# Patient Record
Sex: Male | Born: 1970 | Race: Black or African American | Hispanic: No | Marital: Single | Smoking: Never smoker
Health system: Southern US, Community
[De-identification: ages and names within clinical notes are randomized; demographics above are authoritative.]

## PROBLEM LIST (undated history)

## (undated) DIAGNOSIS — R011 Cardiac murmur, unspecified: Secondary | ICD-10-CM

## (undated) DIAGNOSIS — B1910 Unspecified viral hepatitis B without hepatic coma: Secondary | ICD-10-CM

## (undated) DIAGNOSIS — F259 Schizoaffective disorder, unspecified: Secondary | ICD-10-CM

## (undated) DIAGNOSIS — B159 Hepatitis A without hepatic coma: Secondary | ICD-10-CM

## (undated) DIAGNOSIS — F79 Unspecified intellectual disabilities: Secondary | ICD-10-CM

## (undated) DIAGNOSIS — Q249 Congenital malformation of heart, unspecified: Secondary | ICD-10-CM

---

## 2011-02-24 ENCOUNTER — Emergency Department (HOSPITAL_COMMUNITY)
Admission: EM | Admit: 2011-02-24 | Discharge: 2011-02-27 | Disposition: A | Discharge status: Home / Self Care | Payer: Medicaid Other | Attending: Emergency Medicine | Admitting: Emergency Medicine

## 2011-02-24 DIAGNOSIS — R45850 Homicidal ideations: Secondary | ICD-10-CM | POA: Insufficient documentation

## 2011-02-24 DIAGNOSIS — F79 Unspecified intellectual disabilities: Secondary | ICD-10-CM | POA: Insufficient documentation

## 2011-02-24 DIAGNOSIS — R45851 Suicidal ideations: Secondary | ICD-10-CM | POA: Insufficient documentation

## 2011-02-24 LAB — BASIC METABOLIC PANEL
BUN: 11 mg/dL (ref 6–23)
CO2: 28 mEq/L (ref 19–32)
Chloride: 100 mEq/L (ref 96–112)
Creatinine, Ser: 0.72 mg/dL (ref 0.4–1.5)
Glucose, Bld: 64 mg/dL — ABNORMAL LOW (ref 70–99)
Potassium: 3.7 mEq/L (ref 3.5–5.1)

## 2011-02-24 LAB — RAPID URINE DRUG SCREEN, HOSP PERFORMED
Amphetamines: NOT DETECTED
Benzodiazepines: NOT DETECTED
Tetrahydrocannabinol: NOT DETECTED

## 2011-02-24 LAB — CBC
HCT: 43.4 % (ref 39.0–52.0)
Hemoglobin: 14 g/dL (ref 13.0–17.0)
MCH: 27.5 pg (ref 26.0–34.0)
MCHC: 32.3 g/dL (ref 30.0–36.0)
MCV: 85.1 fL (ref 78.0–100.0)
RBC: 5.1 MIL/uL (ref 4.22–5.81)

## 2011-02-24 LAB — DIFFERENTIAL
Basophils Relative: 0 % (ref 0–1)
Eosinophils Absolute: 0.1 10*3/uL (ref 0.0–0.7)
Eosinophils Relative: 1 % (ref 0–5)
Lymphocytes Relative: 26 % (ref 12–46)
Neutro Abs: 7.1 10*3/uL (ref 1.7–7.7)

## 2011-02-24 LAB — ETHANOL: Alcohol, Ethyl (B): <11 mg/dL — ABNORMAL HIGH (ref 0–10)

## 2011-02-27 DIAGNOSIS — F329 Major depressive disorder, single episode, unspecified: Secondary | ICD-10-CM

## 2011-03-07 NOTE — Consult Note (Signed)
NAMEVEDANTH, SIRICO NO.:  [id number]  MEDICAL RECORD NO.:  [id number]  LOCATION:  [facility]                          FACILITY:  [facility]  PHYSICIAN:  Conni Slipper, MDDATE OF BIRTH:  [birth year]  DATE OF CONSULTATION:  02/27/2011 DATE OF DISCHARGE:  02/27/2011                                CONSULTATION   IDENTIFICATION:  Randall Gomez is a 40 year old single African American young male who was presented to the [facility] with complaining of nervousness, misbehavior.  Reportedly, his aunt called the Emergency Medical Services for behavioral problems.  HISTORY OF PRESENT ILLNESS:  Randall Gomez has been suffering with chronic emotional and behavior problems.  He has been suffering with unknown depression, behavior problems, unable to follow the instructions.  He also has known moderate mental retardation.  The patient reported he has been living with his aunt and uncle and he was unable to behave well. As a result, he has been brought into the emergency department.  The patient denied symptoms of depression, disturbance of sleep, loss of interest.  The patient denied loss of weight, appetite, concentration, or interest.  The patient reported he has been seeing a physician at [facility] for the nervousness.  He has been given citalopram 20 mg daily and also amlodipine 10 mg daily.  The patient reported he has been compliant with his medications.  His last medication taken was February 26, 2011.  PAST PSYCHIATRIC HISTORY:  Not significant.  PAST MEDICAL HISTORY:  He has no significant chronic medical conditions.  CURRENT MEDICATIONS: 1. Citalopram 20 mg daily in the morning. 2. Amlodipine 10 mg daily in the morning.  He has no known drug allergies.  FAMILY HISTORY:  The patient lives with his aunt and uncle, however, he does not have any other family members close by to him.  PHYSICAL EXAMINATION:  MENTAL STATUS:  Randall Gomez is a tall, slender  African American male who was appeared in hospital gown.  He was initially sitting in a chair, later he was able to stand up, walk around a little bit, and also fidgety with his hands.  He has a tissue in his hand, moving from left to right during this evaluation.  The patient reported he has nervousness ongoing, but is not nervous about telepsychiatry equipment.  The patient denied being depressed.  He has appropriately bright and full affect.  He has normal speech, but difficult to understand, sometimes need to be repeated, asking to repeat himself.  He states first letter of his name, but is not clear on the rest of the letters.  The patient denied any suicidal ideation or homicidal ideation, intentions, or plans.  He has not appeared to be interacting with internal stimuli.  He does not have hallucinations either auditory or visual hallucinations.  He does not seem to be paranoid or delusional during this evaluation.  He has fair-to-poor insight, judgment, and impulse control.  DIAGNOSIS:  Axis I: 1. Depressive disorder, not otherwise specified. 2. Anxiety disorder, not otherwise specified. 3. Relationship problems with aunt and uncle. Axis II:  Moderate mental retardation. Axis III:  None. AXIS IV:  Problems with primary support, financial difficulties,  disability, education difficulties. AXIS V:  48-50.  TREATMENT PLAN:  Review of the ACT Team assessment and with information provided during the interview, the patient does not meet criteria for acute psychiatric hospitalization.  Recommended to discharge home with family members and also recommended to follow up with primary care doctor, Dr. Effie Shy, at [facility].  Recommended to compliant with his medication management.  He may need to come back if his clinical situation changes while he is gone from the hospital.     Conni Slipper, MD     JRJ/MEDQ  D:  02/27/2011  T:  02/28/2011  Job:  [id number]  Electronically  Signed by Leata Mouse MD on 03/07/2011 11:59:08 AM

## 2011-05-29 ENCOUNTER — Emergency Department (HOSPITAL_COMMUNITY)
Admission: EM | Admit: 2011-05-29 | Discharge: 2011-05-30 | Disposition: A | Discharge status: Home / Self Care | Payer: Medicaid Other | Attending: Emergency Medicine | Admitting: Emergency Medicine

## 2011-05-29 DIAGNOSIS — F79 Unspecified intellectual disabilities: Secondary | ICD-10-CM | POA: Insufficient documentation

## 2011-05-29 DIAGNOSIS — R4182 Altered mental status, unspecified: Secondary | ICD-10-CM | POA: Insufficient documentation

## 2011-05-29 HISTORY — DX: Unspecified intellectual disabilities: F79

## 2011-05-29 LAB — BASIC METABOLIC PANEL
BUN: 17 mg/dL (ref 6–23)
Chloride: 101 mEq/L (ref 96–112)
GFR calc Af Amer: >60 mL/min (ref >60)
GFR calc non Af Amer: >60 mL/min (ref >60)
Glucose, Bld: 96 mg/dL (ref 70–99)
Potassium: 3.8 mEq/L (ref 3.5–5.1)
Sodium: 133 mEq/L — ABNORMAL LOW (ref 135–145)

## 2011-05-29 LAB — CBC
Hemoglobin: 14 g/dL (ref 13.0–17.0)
MCH: 28.3 pg (ref 26.0–34.0)
Platelets: 271 10*3/uL (ref 150–400)
RBC: 4.94 MIL/uL (ref 4.22–5.81)
WBC: 9.8 10*3/uL (ref 4.0–10.5)

## 2011-05-29 LAB — DIFFERENTIAL
Eosinophils Absolute: 0.3 10*3/uL (ref 0.0–0.7)
Lymphs Abs: 2.3 10*3/uL (ref 0.7–4.0)
Monocytes Relative: 9 % (ref 3–12)
Neutro Abs: 6.3 10*3/uL (ref 1.7–7.7)
Neutrophils Relative %: 64 % (ref 43–77)

## 2011-05-29 LAB — RAPID URINE DRUG SCREEN, HOSP PERFORMED
Barbiturates: NOT DETECTED
Benzodiazepines: NOT DETECTED
Cocaine: NOT DETECTED
Opiates: NOT DETECTED
Tetrahydrocannabinol: NOT DETECTED

## 2011-05-29 MED ORDER — ACETAMINOPHEN 325 MG PO TABS: 650.0000 mg | ORAL_TABLET | ORAL | Status: DC | PRN

## 2011-05-29 MED ORDER — ALUM & MAG HYDROXIDE-SIMETH 200-200-20 MG/5ML PO SUSP: 30.0000 mL | ORAL | Status: DC | PRN

## 2011-05-29 MED ORDER — LORAZEPAM 1 MG PO TABS: 1.0000 mg | ORAL_TABLET | Freq: Three times a day (TID) | ORAL | Status: DC | PRN

## 2011-05-29 MED ORDER — RISPERIDONE 1 MG PO TABS
2.0000 mg | ORAL_TABLET | Freq: Every day | ORAL | Status: DC
  Administered 2011-05-30: 2 mg via ORAL
  Filled 2011-05-29: qty 1

## 2011-05-29 MED ORDER — IBUPROFEN 400 MG PO TABS: 600.0000 mg | ORAL_TABLET | Freq: Three times a day (TID) | ORAL | Status: DC | PRN

## 2011-05-29 MED ORDER — ONDANSETRON HCL 4 MG PO TABS: 4.0000 mg | ORAL_TABLET | Freq: Three times a day (TID) | ORAL | Status: DC | PRN

## 2011-05-29 MED ORDER — ZIPRASIDONE HCL 20 MG PO CAPS
20.0000 mg | ORAL_CAPSULE | Freq: Two times a day (BID) | ORAL | Status: DC
  Administered 2011-05-30: 20 mg via ORAL
  Filled 2011-05-29: qty 1

## 2011-05-29 MED ORDER — ZIPRASIDONE MESYLATE 20 MG IM SOLR
10.0000 mg | Freq: Once | INTRAMUSCULAR | Status: AC
  Administered 2011-05-30: 10 mg via INTRAMUSCULAR
  Filled 2011-05-29: qty 20

## 2011-05-29 MED ORDER — ZOLPIDEM TARTRATE 5 MG PO TABS: 5.0000 mg | ORAL_TABLET | Freq: Every evening | ORAL | Status: DC | PRN

## 2011-05-29 MED ORDER — NICOTINE 21 MG/24HR TD PT24
21.0000 mg | MEDICATED_PATCH | Freq: Every day | TRANSDERMAL | Status: DC
  Filled 2011-05-29: qty 1

## 2011-05-29 NOTE — ED Notes (Signed)
Pt  Sitting on edge of bed watching TV. States " my aunt brought me here to talk to the man for mental health."  Pt denies suicidal thoughts or thoughts to harm anyone. Pt says he raked leaves and grass in the yard today, ate lunch and finished the yard. Denies aggressiveness with aunt.

## 2011-05-29 NOTE — ED Notes (Signed)
Pt reports needing help for his mental illness.  Pt is requesting a "room downstairs and something to eat".  nad noted

## 2011-05-29 NOTE — ED Provider Notes (Addendum)
Scribed for Donnetta Hutching, MD, the patient was seen in room [facility]09[facility]09 . This chart was scribed by Ellie Lunch. This patient's care was started at 6:04 PM.   CSN: [id number] Arrival date & time: 05/29/2011  5:44 PM  Chief Complaint  Patient presents with  . Psychiatric Evaluation    Level 5 caveat for altered mental status. Randall Gomez is a 40 y.o. male with a history of mental retardation presents to the Emergency Department for psychiatric evaluation. Pt states he was by his aunt for admission. Pt says he was "acting up" and his mind is racing. Denies Suicidal and homicidal ideations. Level V caveat mental retardation in an urgent need for intervention Patient seen at 18:04.   Past Medical History  Diagnosis Date  . Mental retardation     History reviewed. No pertinent past surgical history.  No family history on file.  History  Substance Use Topics  . Smoking status: Not on file  . Smokeless tobacco: Not on file  . Alcohol Use:   lives with aunt   Review of Systems  Unable to perform ROS: Mental status change  Psychiatric/Behavioral:       Flight of ideas    Physical Exam  BP 148/81  Pulse 115  Temp(Src) 98.2 F (36.8 C) (Oral)  Resp 21  SpO2 99%  Physical Exam  Nursing note and vitals reviewed. Constitutional: He is oriented to person, place, and time. He appears well-developed and well-nourished.  HENT:  Head: Normocephalic and atraumatic.  Eyes: Conjunctivae and EOM are normal. Pupils are equal, round, and reactive to light.  Neck: Neck supple.  Cardiovascular: Normal rate and regular rhythm.   Pulmonary/Chest: Effort normal and breath sounds normal.  Abdominal: Soft. Bowel sounds are normal.  Musculoskeletal: Normal range of motion.  Neurological: He is alert and oriented to person, place, and time.  Skin: Skin is warm and dry.  Psychiatric:       Flight of ideas. Unable to stay on task. No suicidal or homicidal ideation.    Procedures  OTHER DATA  REVIEWED: Nursing notes, vital signs, and past medical records reviewed.  DIAGNOSTIC STUDIES: Oxygen Saturation is 99% on room air, normal by my interpretation.    LABS / RADIOLOGY:  Results for orders placed during the hospital encounter of 05/29/11  CBC      Component Value Range   WBC 9.8  4.0 - 10.5 (K/uL)   RBC 4.94  4.22 - 5.81 (MIL/uL)   Hemoglobin 14.0  13.0 - 17.0 (g/dL)   HCT 91.4  78.2 - 95.6 (%)   MCV 85.6  78.0 - 100.0 (fL)   MCH 28.3  26.0 - 34.0 (pg)   MCHC 33.1  30.0 - 36.0 (g/dL)   RDW 21.3  08.6 - 57.8 (%)   Platelets 271  150 - 400 (K/uL)  DIFFERENTIAL      Component Value Range   Neutrophils Relative 64  43 - 77 (%)   Neutro Abs 6.3  1.7 - 7.7 (K/uL)   Lymphocytes Relative 23  12 - 46 (%)   Lymphs Abs 2.3  0.7 - 4.0 (K/uL)   Monocytes Relative 9  3 - 12 (%)   Monocytes Absolute 0.9  0.1 - 1.0 (K/uL)   Eosinophils Relative 3  0 - 5 (%)   Eosinophils Absolute 0.3  0.0 - 0.7 (K/uL)   Basophils Relative 0  0 - 1 (%)   Basophils Absolute 0.0  0.0 - 0.1 (K/uL)  BASIC METABOLIC PANEL  Component Value Range   Sodium 133 (*) 135 - 145 (mEq/L)   Potassium 3.8  3.5 - 5.1 (mEq/L)   Chloride 101  96 - 112 (mEq/L)   CO2 24  19 - 32 (mEq/L)   Glucose, Bld 96  70 - 99 (mg/dL)   BUN 17  6 - 23 (mg/dL)   Creatinine, Ser 4.09  0.50 - 1.35 (mg/dL)   Calcium 9.4  8.4 - 81.1 (mg/dL)   GFR calc non Af Amer >60  >60 (mL/min)   GFR calc Af Amer >60  >60 (mL/min)  ETHANOL      Component Value Range   Alcohol, Ethyl (B) <11  0 - 11 (mg/dL)  URINE RAPID DRUG SCREEN (HOSP PERFORMED)      Component Value Range   Opiates NONE DETECTED  NONE DETECTED    Cocaine NONE DETECTED  NONE DETECTED    Benzodiazepines NONE DETECTED  NONE DETECTED    Amphetamines NONE DETECTED  NONE DETECTED    Tetrahydrocannabinol NONE DETECTED  NONE DETECTED    Barbiturates NONE DETECTED  NONE DETECTED    ED COURSE / COORDINATION OF CARE:  MDM:  Discussed treatment plan with PT. Check blood  work. Behavioral health consult.   IMPRESSION: Diagnoses that have been ruled out:  Diagnoses that are still under consideration:  Final diagnoses:   SCRIBE ATTESTATION: I personally performed the services described in this documentation, which was scribed in my presence. The recorded information has been reviewed and considered. Donnetta Hutching, MD          Donnetta Hutching, MD 05/30/11 0023  Donnetta Hutching, MD 05/30/11 9147  Donnetta Hutching, MD 05/30/11 [phone]

## 2011-05-29 NOTE — ED Notes (Signed)
Pt denies any SI/HI

## 2011-05-30 NOTE — ED Notes (Signed)
Pt up eating no stated needs no noted distress

## 2011-05-30 NOTE — ED Notes (Signed)
Pt left with his aunt both stating no needs

## 2011-05-30 NOTE — ED Notes (Signed)
Pt ambulated to restroom. 

## 2011-05-30 NOTE — ED Provider Notes (Signed)
Pt has been resting comfortably in the ED since arrival yesterday. Aunt at the bedside now states she has been taking care of him for several years and has recently moved to this area. In [place], where she used to live, he had day care type activities to do during the day, but she has not been able to get him established with those kinds of resources in this area. She is comfortable taking him home, but needs phone numbers to call to help arrange for him to have some home care help.   Neftaly Inzunza B. Bernette Mayers, MD 05/30/11 1555

## 2011-05-30 NOTE — ED Notes (Signed)
spoke to pt's aunt Byrd Hesselbach she will bring pt's medication list here after church today around 2pm

## 2011-05-30 NOTE — ED Notes (Signed)
Pt resting watching tv

## 2011-05-30 NOTE — ED Notes (Signed)
Pt sleeping. 

## 2012-09-16 ENCOUNTER — Emergency Department (HOSPITAL_COMMUNITY): Payer: Medicaid Other

## 2012-09-16 ENCOUNTER — Other Ambulatory Visit: Payer: Self-pay

## 2012-09-16 ENCOUNTER — Encounter (HOSPITAL_COMMUNITY): Payer: Self-pay | Admitting: Emergency Medicine

## 2012-09-16 ENCOUNTER — Emergency Department (HOSPITAL_COMMUNITY)
Admission: EM | Admit: 2012-09-16 | Discharge: 2012-09-16 | Disposition: A | Discharge status: Home / Self Care | Payer: Medicaid Other | Attending: Emergency Medicine | Admitting: Emergency Medicine

## 2012-09-16 DIAGNOSIS — F79 Unspecified intellectual disabilities: Secondary | ICD-10-CM | POA: Insufficient documentation

## 2012-09-16 DIAGNOSIS — Z79899 Other long term (current) drug therapy: Secondary | ICD-10-CM | POA: Insufficient documentation

## 2012-09-16 DIAGNOSIS — R002 Palpitations: Secondary | ICD-10-CM | POA: Insufficient documentation

## 2012-09-16 LAB — BASIC METABOLIC PANEL
BUN: 13 mg/dL (ref 6–23)
Chloride: 102 mEq/L (ref 96–112)
GFR calc Af Amer: >90 mL/min (ref >90)
Potassium: 4.4 mEq/L (ref 3.5–5.1)
Sodium: 139 mEq/L (ref 135–145)

## 2012-09-16 NOTE — ED Notes (Signed)
Pt's aunt called by number provided by said aunt [phone], number provided is incorrect. No emergency contact listed in chart.

## 2012-09-16 NOTE — ED Notes (Signed)
Patient brought in via EMS. Alert and oriented. Airway patent. Per EMS called out by aunt because patient c/o "funny feeling in chest briefly." Denied any when they arrived on scene. Denies any pain, shortness of breath, dizziness, nausea. 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

## 2012-09-17 NOTE — ED Provider Notes (Signed)
History     CSN: [id number]  Arrival date & time 09/16/12  1191   First MD Initiated Contact with Patient 09/16/12 [phone]      Chief Complaint  Patient presents with  . Chest Pain    (Consider location/radiation/quality/duration/timing/severity/associated sxs/prior treatment) HPI Comments: Randall Gomez presents by ems for evaluation of a brief "funny feeling" in his chest which occurred 1.5 hours prior to arrival here.  When questioned further,  He was able to describe a fluttering which lasted for less than one minute while he was sitting in his bedroom shortly after waking.  He did not have chest pain, shortness of breath, nausea or vomiting.  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  Patient has mild MR and is cared for by his aunt.    The history is provided by the patient and the EMS personnel. The history is limited by a developmental delay.    Past Medical History  Diagnosis Date  . Mental retardation     History reviewed. No pertinent past surgical history.  Family History  Problem Relation Age of Onset  . Diabetes Other   . Hypertension Other     History  Substance Use Topics  . Smoking status: Never Smoker   . Smokeless tobacco: Never Used  . Alcohol Use: No      Review of Systems  Constitutional: Negative for fever.  HENT: Negative for congestion and sore throat.   Eyes: Negative.   Respiratory: Negative for chest tightness and shortness of breath.   Cardiovascular: Positive for palpitations. Negative for chest pain and leg swelling.  Gastrointestinal: Negative for nausea and abdominal pain.  Genitourinary: Negative.   Musculoskeletal: Negative for joint swelling and arthralgias.  Skin: Negative.  Negative for rash and wound.  Neurological: Negative for dizziness, weakness, light-headedness, numbness and headaches.  Hematological: Negative.     Psychiatric/Behavioral: Negative.     Allergies  Review of patient's allergies indicates no known allergies.  Home Medications   Current Outpatient Rx  Name  Route  Sig  Dispense  Refill  . BENZTROPINE MESYLATE 0.5 MG PO TABS   Oral   Take 0.5 mg by mouth 2 (two) times daily.         Marland Kitchen HALOPERIDOL 10 MG PO TABS   Oral   Take 10 mg by mouth 2 (two) times daily.           BP 133/82  Pulse 92  Temp 98 F (36.7 C) (Oral)  Resp 20  Ht 5\' 6"  (1.676 m)  Wt 250 lb (113.399 kg)  BMI 40.35 kg/m2  SpO2 96%  Physical Exam  Nursing note and vitals reviewed. Constitutional: He appears well-developed and well-nourished. No distress.       Although he has a h/o MR,  Answers most questions appropriately and thoughtfully.  Can express his sx with assistance.  HENT:  Head: Normocephalic and atraumatic.  Eyes: Conjunctivae normal are normal.  Neck: Normal range of motion.  Cardiovascular: Normal rate, regular rhythm, normal heart sounds and intact distal pulses.  Exam reveals no friction rub.   No murmur heard. Pulmonary/Chest: Effort normal and breath sounds normal. He has no wheezes.  Abdominal: Soft. Bowel sounds are normal. There is no tenderness.  Musculoskeletal: Normal range of motion.  Neurological: He is alert.  Skin: Skin is warm and dry.  Psychiatric: He has a normal mood and affect.  ED Course  Procedures (including critical care time)   Labs Reviewed  TROPONIN I  BASIC METABOLIC PANEL  TROPONIN I  LAB REPORT - SCANNED   Dg Chest Portable 1 View  09/16/2012  *RADIOLOGY REPORT*  Clinical Data: Chest pain.  PORTABLE CHEST - 1 VIEW  Comparison: None.  Findings: Low lung volumes.  No confluent airspace opacities or effusions.  Heart is normal size.  No bony abnormality.  IMPRESSION: Low lung volumes.  No acute findings.   Original Report Authenticated By: Charlett Nose, M.D.      1. Palpitations       MDM  Ekg, cxr,  Labs including 2 troponins 3 hours  apart wnl.  Pt without new sx in ed.  Appropriate for dc home,  Given referral to [facility] cardiology for further management of sx and to follow reported valve anomoly.        Date: 09/17/2012  Rate:89  Rhythm: normal sinus rhythm  QRS Axis: normal  Intervals: normal  ST/T Wave abnormalities: normal  Conduction Disutrbances:none  Narrative Interpretation:   Old EKG Reviewed: none available     Burgess Amor, PA 09/17/12 1243

## 2012-09-19 NOTE — ED Provider Notes (Signed)
Medical screening examination/treatment/procedure(s) were performed by non-physician practitioner and as supervising physician I was immediately available for consultation/collaboration.  Erionna Strum, MD 09/19/12 1358 

## 2013-04-19 ENCOUNTER — Emergency Department (HOSPITAL_COMMUNITY): Payer: Medicaid Other

## 2013-04-19 ENCOUNTER — Encounter (HOSPITAL_COMMUNITY): Payer: Self-pay | Admitting: Emergency Medicine

## 2013-04-19 ENCOUNTER — Emergency Department (HOSPITAL_COMMUNITY)
Admission: EM | Admit: 2013-04-19 | Discharge: 2013-04-19 | Disposition: A | Discharge status: Home / Self Care | Payer: Medicaid Other | Attending: Emergency Medicine | Admitting: Emergency Medicine

## 2013-04-19 DIAGNOSIS — R42 Dizziness and giddiness: Secondary | ICD-10-CM | POA: Insufficient documentation

## 2013-04-19 DIAGNOSIS — Z79899 Other long term (current) drug therapy: Secondary | ICD-10-CM | POA: Insufficient documentation

## 2013-04-19 DIAGNOSIS — R079 Chest pain, unspecified: Secondary | ICD-10-CM

## 2013-04-19 DIAGNOSIS — R61 Generalized hyperhidrosis: Secondary | ICD-10-CM | POA: Insufficient documentation

## 2013-04-19 DIAGNOSIS — F79 Unspecified intellectual disabilities: Secondary | ICD-10-CM | POA: Insufficient documentation

## 2013-04-19 DIAGNOSIS — R0789 Other chest pain: Secondary | ICD-10-CM | POA: Insufficient documentation

## 2013-04-19 LAB — CBC WITH DIFFERENTIAL/PLATELET
Eosinophils Absolute: 0.2 10*3/uL (ref 0.0–0.7)
Hemoglobin: 14.3 g/dL (ref 13.0–17.0)
Lymphocytes Relative: 29 % (ref 12–46)
Lymphs Abs: 3 10*3/uL (ref 0.7–4.0)
MCH: 28.5 pg (ref 26.0–34.0)
Monocytes Relative: 13 % — ABNORMAL HIGH (ref 3–12)
Neutro Abs: 5.8 10*3/uL (ref 1.7–7.7)
Neutrophils Relative %: 56 % (ref 43–77)
RBC: 5.02 MIL/uL (ref 4.22–5.81)
WBC: 10.4 10*3/uL (ref 4.0–10.5)

## 2013-04-19 LAB — BASIC METABOLIC PANEL
BUN: 18 mg/dL (ref 6–23)
CO2: 20 mEq/L (ref 19–32)
Chloride: 100 mEq/L (ref 96–112)
Glucose, Bld: 88 mg/dL (ref 70–99)
Potassium: 3.7 mEq/L (ref 3.5–5.1)
Sodium: 137 mEq/L (ref 135–145)

## 2013-04-19 LAB — TROPONIN I: Troponin I: <0.3 ng/mL (ref <0.30)

## 2013-04-19 MED ORDER — ASPIRIN 81 MG PO CHEW
324.0000 mg | CHEWABLE_TABLET | Freq: Once | ORAL | Status: AC
  Administered 2013-04-19: 324 mg via ORAL
  Filled 2013-04-19: qty 4

## 2013-04-19 NOTE — ED Provider Notes (Signed)
CSN: [id number]     Arrival date & time 04/19/13  1456 History     First MD Initiated Contact with Patient 04/19/13 1516     Chief Complaint  Patient presents with  . Chest Pain   (Consider location/radiation/quality/duration/timing/severity/associated sxs/prior Treatment) Patient is a 42 y.o. male presenting with chest pain.  Chest Pain Pain location:  L chest Pain quality comment:  "chest pain" Pain radiates to:  Does not radiate Pain severity now: "bad" Onset quality:  Unable to specify Duration:  4 hours Timing:  Constant Progression:  Unchanged Relieved by:  Nothing Worsened by:  Nothing tried Associated symptoms: diaphoresis and dizziness   Associated symptoms: no abdominal pain, no cough, no fever, no nausea, no shortness of breath and not vomiting     Past Medical History  Diagnosis Date  . Mental retardation    History reviewed. No pertinent past surgical history. Family History  Problem Relation Age of Onset  . Diabetes Other   . Hypertension Other    History  Substance Use Topics  . Smoking status: Never Smoker   . Smokeless tobacco: Current User    Types: Chew  . Alcohol Use: No    Review of Systems  Constitutional: Positive for diaphoresis. Negative for fever.  HENT: Negative for congestion.   Respiratory: Negative for cough and shortness of breath.   Cardiovascular: Positive for chest pain.  Gastrointestinal: Negative for nausea, vomiting, abdominal pain and diarrhea.  Neurological: Positive for dizziness.  All other systems reviewed and are negative.    Allergies  Review of patient's allergies indicates no known allergies.  Home Medications   Current Outpatient Rx  Name  Route  Sig  Dispense  Refill  . benztropine (COGENTIN) 0.5 MG tablet   Oral   Take 0.5 mg by mouth 2 (two) times daily.         . haloperidol (HALDOL) 10 MG tablet   Oral   Take 10 mg by mouth 2 (two) times daily.          BP 143/81  Pulse 92  Temp(Src) 99.8  F (37.7 C) (Oral)  Resp 18  Ht 5\' 9"  (1.753 m)  Wt 250 lb (113.399 kg)  BMI 36.9 kg/m2  SpO2 98% Physical Exam  Nursing note and vitals reviewed. Constitutional: He is oriented to person, place, and time. He appears well-developed and well-nourished. No distress.  HENT:  Head: Normocephalic and atraumatic.  Mouth/Throat: Oropharynx is clear and moist.  Eyes: Conjunctivae are normal. Pupils are equal, round, and reactive to light. No scleral icterus.  Neck: Neck supple.  Cardiovascular: Normal rate, regular rhythm and intact distal pulses.   Murmur heard.  Systolic murmur is present with a grade of 1/6  Pulmonary/Chest: Effort normal and breath sounds normal. No stridor. No respiratory distress. He has no wheezes. He has no rales.  Abdominal: Soft. He exhibits no distension. There is no tenderness.  Musculoskeletal: Normal range of motion. He exhibits no edema.  Neurological: He is alert and oriented to person, place, and time.  Skin: Skin is warm. No rash noted. He is diaphoretic (slightl).  Psychiatric: He has a normal mood and affect. His behavior is normal.  Slightly delayed responses    ED Course   Procedures (including critical care time)  Labs Reviewed  CBC WITH DIFFERENTIAL - Abnormal; Notable for the following:    Monocytes Relative 13 (*)    Monocytes Absolute 1.4 (*)    All other components within normal limits  BASIC METABOLIC PANEL  TROPONIN I  TROPONIN I   Dg Chest 2 View  04/19/2013   *RADIOLOGY REPORT*  Clinical Data: Chest pain  CHEST - 2 VIEW  Comparison: September 16, 2012  Findings: Lungs clear.  Heart size and pulmonary vascularity are normal.  No adenopathy.  No bone lesions.  IMPRESSION: No abnormality noted.   Original Report Authenticated By: Bretta Bang, M.D.  All radiology studies independently viewed by me.     EKG -NSR, rate 93, normal axis, normal intervals, no ST/T changes, similar to prior.   1. Chest pain     MDM  42 yo male with  hx of MR presenting with chest pain.  History somewhat limited by MR, but he is able to provide reasonably accurate details.  Caregiver also helps.  PERC negative.    6:25 PM pain is now resolved.  Initial labwork normal.  Plan to check delta troponin given limited history.    8:23 PM Pain still gone.  Delta trop negative.  Well appearing.  Will dc home with cardiology follow up.    Candyce Churn, MD 04/19/13 2024

## 2013-04-19 NOTE — ED Notes (Signed)
Patient arrives with Aunt who has guardianship of patient. Patient c/o mid sternal chest pain without radiation. Patient unable to describe pain other than it hurts "real bad" Patient in no respiratory distress, but is diaphoretic. Guardian states pain has a heart defect, unable to state what other than, "he has a hole in his heart". Patient alert and at baseline mental status.

## 2013-04-19 NOTE — ED Notes (Signed)
Patient ringing call bell and is hard to understand. When entering his room he is asking for something to help him go to sleep. Explained to patient that the Dr has to see him first.

## 2013-04-19 NOTE — ED Notes (Signed)
Discharge instructions given and reviewed with patient.  Patient verbalized understanding to follow up with [facility].  Patient ambulatory; discharged home in good condition.  Patient's aunt to drive home.

## 2013-04-19 NOTE — ED Notes (Signed)
Assumed care of patient at this time.  Patient sitting up in bed watching television.  Patient denies pain at this time.  Patient awaiting a second troponin and disposition.

## 2017-03-07 ENCOUNTER — Emergency Department (HOSPITAL_COMMUNITY)
Admission: EM | Admit: 2017-03-07 | Discharge: 2017-03-10 | Disposition: A | Discharge status: Home / Self Care | Payer: Medicaid Other | Attending: Emergency Medicine | Admitting: Emergency Medicine

## 2017-03-07 ENCOUNTER — Encounter (HOSPITAL_COMMUNITY): Payer: Self-pay | Admitting: *Deleted

## 2017-03-07 DIAGNOSIS — F17220 Nicotine dependence, chewing tobacco, uncomplicated: Secondary | ICD-10-CM | POA: Insufficient documentation

## 2017-03-07 DIAGNOSIS — Z8659 Personal history of other mental and behavioral disorders: Secondary | ICD-10-CM | POA: Diagnosis not present

## 2017-03-07 DIAGNOSIS — F209 Schizophrenia, unspecified: Secondary | ICD-10-CM | POA: Diagnosis not present

## 2017-03-07 DIAGNOSIS — F79 Unspecified intellectual disabilities: Secondary | ICD-10-CM

## 2017-03-07 DIAGNOSIS — Z79899 Other long term (current) drug therapy: Secondary | ICD-10-CM | POA: Diagnosis not present

## 2017-03-07 DIAGNOSIS — R45850 Homicidal ideations: Secondary | ICD-10-CM | POA: Diagnosis present

## 2017-03-07 LAB — CBC WITH DIFFERENTIAL/PLATELET
BASOS ABS: 0 10*3/uL (ref 0.0–0.1)
BASOS PCT: 0 %
Eosinophils Absolute: 0.4 10*3/uL (ref 0.0–0.7)
Eosinophils Relative: 4 %
HCT: 40.3 % (ref 39.0–52.0)
Hemoglobin: 13.3 g/dL (ref 13.0–17.0)
Lymphocytes Relative: 24 %
Lymphs Abs: 2.5 10*3/uL (ref 0.7–4.0)
MCH: 28.4 pg (ref 26.0–34.0)
MCHC: 33 g/dL (ref 30.0–36.0)
MCV: 86.1 fL (ref 78.0–100.0)
Monocytes Absolute: 1.3 10*3/uL — ABNORMAL HIGH (ref 0.1–1.0)
Monocytes Relative: 13 %
NEUTROS PCT: 59 %
Neutro Abs: 6 10*3/uL (ref 1.7–7.7)
Platelets: ADEQUATE 10*3/uL (ref 150–400)
RBC: 4.68 MIL/uL (ref 4.22–5.81)
RDW: 13.7 % (ref 11.5–15.5)
WBC: 10.2 10*3/uL (ref 4.0–10.5)

## 2017-03-07 LAB — BASIC METABOLIC PANEL
ANION GAP: 6 (ref 5–15)
BUN: 23 mg/dL — AB (ref 6–20)
CO2: 27 mmol/L (ref 22–32)
Calcium: 9.3 mg/dL (ref 8.9–10.3)
Chloride: 106 mmol/L (ref 101–111)
Creatinine, Ser: 1.07 mg/dL (ref 0.61–1.24)
Glucose, Bld: 96 mg/dL (ref 65–99)
POTASSIUM: 4.4 mmol/L (ref 3.5–5.1)
SODIUM: 139 mmol/L (ref 135–145)

## 2017-03-07 LAB — ETHANOL: Alcohol, Ethyl (B): <5 mg/dL (ref <5)

## 2017-03-07 NOTE — ED Triage Notes (Signed)
Involuntary commitment 

## 2017-03-08 LAB — RAPID URINE DRUG SCREEN, HOSP PERFORMED
AMPHETAMINES: NOT DETECTED
BARBITURATES: NOT DETECTED
BENZODIAZEPINES: NOT DETECTED
COCAINE: NOT DETECTED
Opiates: NOT DETECTED
TETRAHYDROCANNABINOL: NOT DETECTED

## 2017-03-08 MED ORDER — TRAZODONE HCL 50 MG PO TABS
50.0000 mg | ORAL_TABLET | Freq: Every evening | ORAL | Status: DC | PRN
  Administered 2017-03-09: 50 mg via ORAL
  Filled 2017-03-08: qty 1

## 2017-03-08 MED ORDER — OLANZAPINE 5 MG PO TABS
5.0000 mg | ORAL_TABLET | Freq: Two times a day (BID) | ORAL | Status: DC
  Administered 2017-03-08 – 2017-03-10 (×5): 5 mg via ORAL
  Filled 2017-03-08 (×6): qty 1

## 2017-03-08 MED ORDER — HYDROXYZINE HCL 25 MG PO TABS
25.0000 mg | ORAL_TABLET | Freq: Three times a day (TID) | ORAL | Status: DC | PRN
  Administered 2017-03-09 (×2): 25 mg via ORAL
  Filled 2017-03-08 (×2): qty 1

## 2017-03-08 NOTE — ED Notes (Signed)
Pt ambulatory to the bathroom at this time.

## 2017-03-08 NOTE — ED Notes (Signed)
Pt & sitter informed of need for urine sample

## 2017-03-08 NOTE — ED Notes (Signed)
Pt cooperative at this time. Pt has very rapid speech when talking with this nurse.

## 2017-03-08 NOTE — Progress Notes (Signed)
CSW attempted to contact patient's nurse regarding additional contact information. No answer, will continue to follow.   Baldo DaubJolan Hazel Wrinkle MSW, LCSWA CSW Disposition [phone]

## 2017-03-08 NOTE — Progress Notes (Signed)
CSW attempted to contact patient's aunt/caretaker regarding IQ scores. No answer, was not able to leave message. CSW will continue to follow.   Baldo DaubJolan Natallia Stellmach MSW, LCSWA CSW Disposition [phone]

## 2017-03-08 NOTE — Progress Notes (Addendum)
CSW obtained copy of pt's IVC and got his aunt's [name](Maria Silver-Jenkins) current phone number.  [phone]. CSW left HIPAA compliant message requesting a return call.  CSW attempting to obtain information on the following:  1) Psych/FSIQ testing;  2) Guardianship/POA information;   3) Is pt. receiving care coordination services through       [facility] NOT CURRENTLY A CLIENT.  PLEASE GIVE AUNT INFORMATION REGARDING CARE COORDINATION SERVICES INCLUDING PHONE # [phone] 4) May pt return to residence upon d/c?  CSW will request follow-up from day shift Disposition CSW if contact not made with pt's aunt this evening.  Timmothy EulerJean T. Kaylyn LimSutter, MSW, LCSWA Clinical Social Work Disposition [phone]
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
## 2017-03-08 NOTE — Progress Notes (Signed)
CSW made two attempts to contact pt's aunt regarding psych/IQ testing and possible guardianship papers and or POA paperwork. There was no answer and no voice mail.    CSW will request copy of pt's IVC to determine if there is a different number to contact pt's aunt who had him IVC'd.  Randall EulerJean T. Kaylyn LimSutter, MSW, LCSWA Clinical Social Work Disposition [phone]

## 2017-03-08 NOTE — Progress Notes (Signed)
03/08/17 @ 13:14 CSW attempted to contact the pt's aunt to obtain psych testing and IQ score, no answer, unable to leave v/m as no v/m system is set up.   Princess BruinsAquicha Zyion Doxtater, MSW, LCSWA CSW Disposition [phone]

## 2017-03-08 NOTE — ED Notes (Signed)
Pt given meal tray.

## 2017-03-09 DIAGNOSIS — Z8659 Personal history of other mental and behavioral disorders: Secondary | ICD-10-CM | POA: Diagnosis not present

## 2017-03-09 DIAGNOSIS — F79 Unspecified intellectual disabilities: Secondary | ICD-10-CM

## 2017-03-09 NOTE — ED Notes (Signed)
Per Maralyn SagoSarah, RN,  Lucretia FieldJean Sutton called from [facility].  They having been trying to get in contract with POA but not getting an answer.  Adult Protective Services report has been made and will visit tomorrow.  Staff may call for update at [phone] or [phone].

## 2017-03-09 NOTE — ED Provider Notes (Addendum)
Psych recommends discharge. Patient has been calm and resting comfortably in the ED for about 40 hours. No agitation or anger. No SI/HI.  I talked to the aunt in the emergency department and she relates that he has anger issues that have been long-standing. She and her husband are finding it difficult to care for him. Psychiatry has discussed with her and social work resources were given to her. She is able to take care of him at home currently. However he denies any suicidal or homicidal thoughts and has been quite calm here in the ER. No indication for acute psychiatric admission for psychiatry. No evidence of an acute medical emergency. Discharge home with return precautions. IVC rescinded   Pricilla LovelessGoldston, Jayke Caul, MD 03/09/17 1558  Prior to discharge, and now informs myself and nursing staff that she is not going to take the patient home. She states that the patient was "starting up again" and that she cannot handle him at home. She then proceeded to walk out of the emergency department. Given his mental retardation and psychiatric history I do not think he is stable to be discharged out on his own with no place to go. The social work was consulted, care transferred oncoming physician.    Pricilla LovelessGoldston, Journiee Feldkamp, MD 03/09/17 [phone]

## 2017-03-09 NOTE — Progress Notes (Signed)
CSW informed by [facility] ED Nurse, Daphyne, that patient's guardian and POA, Jorge NyMaria Silver-Jenkins came to pick him up but then refused to take pt home.  Per MR, pt has an IDD diagnosis, although we have no documentation of FSIQ or any other psych testing.  CSW placed calls to all numbers left for Ms. Silver-Jenkins [phone] and [phone]) and left HIPAA compliant messages asking her to return call so this writer could discuss her refusal to take patient home with her.    [facility] [facility] and made a report to Rachael FeeAnna Sizemore, APS Social Worker, who stated that she would contact her supervisor in order to advise that a report had been made.  Ms. Philis KendallSizemore indicated that it was likely that no one from their office would be out to see patient today as it is late in the afternoon.    CSW left message for [facility] SW, Exelon CorporationHeather Settle,  regarding same.  CSW called [facility] ED and notified that APS had been contacted.  Timmothy EulerJean T. Kaylyn LimSutter, MSW, LCSWA Clinical Social Work Disposition [phone]

## 2017-03-09 NOTE — ED Notes (Signed)
Pt's POA came to nurses station saying, "I'm not taking him home with me."  Dr Criss AlvineGoldston notified and goes in room speaking with POA.   Following MD leaving room, POA walks out of the room and says, "I am not taking him with me."  Dr Criss AlvineGoldston informed that pt's POA left before discharge papers and IVC papers rescinding completed.
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
## 2017-03-09 NOTE — Progress Notes (Signed)
Patient's provider is [facility] [employer] [employer]. CSW has faxed outpatient resources to RN to review with patient's legal guardian when she picks patient up for discharge.    Baldo DaubJolan Jeston Junkins MSW, LCSWA CSW Disposition [phone]
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
## 2017-03-09 NOTE — Progress Notes (Signed)
CSW spoke with TTS counselor BL who states she also attempted to contact the pt's aunt in order to obtain collateral information and was unable to reach anyone. Per [name], NP APS report to be made if the pt's aunt does not contact [facility] or [facility] in order to provide collateral information including psych testing and IQ scores for the pt. [name], SW at [facility] notified of the need for an APS report if the pt's aunt does not cooperate with [facility] services.   Randall BruinsAquicha Senia Gomez, MSW, LCSWA CSW Disposition [phone]

## 2017-03-09 NOTE — Progress Notes (Signed)
CSW spoke with patient's aunt/caretaker Shelda JakesMaria Jenkins [phone])  regarding guardianship and patient's psych testing and IQ test scores for placement.   Byrd HesselbachMaria confirmed that she was the legal guardian. When asked about the reason for the patient being IVC'd, she stated "I just need some help with him". Byrd HesselbachMaria then confirmed that she would go to [facility] to speak with Child psychotherapistsocial worker and nurse regarding situation and resources. Byrd HesselbachMaria stated that she would prefer to go [facility] in person instead of talking on the phone. She stated that she would be at [facility] around 4:00pm.   Baldo DaubJolan Maddy Graham MSW, LCSWA CSW Disposition [phone]

## 2017-03-09 NOTE — ED Notes (Signed)
Tinisha to return call once tele-monitor on.

## 2017-03-09 NOTE — Clinical Social Work Note (Signed)
LCSW spoke with Princess BruinsAquicha Duff who advised that disposition had made multiple attempts to contact patient's in regards to psychological testing. LCSW contacted [employer] to non emergency number and spoke with [facility]. LCSW requested a welfare check to patient's aunt and provided the information listed on the chart.  Carollee HerterShannon stated that a welfare check would be completed and that she would provide contact information to patient's aunt/guardian should they be able to locate her.     Baudelia Schroepfer, Juleen ChinaHeather D, LCSW

## 2017-03-09 NOTE — ED Notes (Signed)
Pt has been calm and cooperative all day.  Follows commands.  No c/o pain or discomfort.
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
## 2017-03-09 NOTE — Discharge Instructions (Addendum)
Follow-up with community mental health resources °

## 2017-03-09 NOTE — Progress Notes (Signed)
CSW spoke with Randall GuadeloupeLaurie Parks, NP who reassessed the pt and states the pt is psych cleared and does not meet inpt criteria. Pt currently denies SI/HI/AVH. Daphne, RN notified of the recommendation. EDP notified and in agreement with disposition. CSW will fax resources for the pt's guardian to follow up with to  [phone]. CSW attempted to speak with the pt's guardian to advise he is being d/c and will need to be picked up. No ans, HIPPA compliant voicemail was left for the guardian.  Randall Gomez, MSW, LCSWA CSW Disposition [phone]

## 2017-03-09 NOTE — ED Notes (Signed)
POA given outpatient resources papers.

## 2017-03-09 NOTE — Progress Notes (Signed)
CSW spoke with patient's aunt/guardian Randall JakesMaria Gomez regarding D/C.  CSW will fax outpatient resources. Randall verbalizes understanding and agreed to pick patient up at 4:00.  Randall Gomez, MSW, LCSWA CSW Disposition [phone]

## 2017-03-10 NOTE — ED Notes (Signed)
Pt ambulated to [facility]

## 2017-03-10 NOTE — ED Notes (Signed)
Pt just finished eating breakfast tray now sitting quietly in room

## 2017-03-10 NOTE — ED Notes (Signed)
Guardian is here to pick up pt. Requesting to speak with social worker Peter Kiewit Sonsheather

## 2017-03-10 NOTE — ED Notes (Signed)
[facility]

## 2017-03-10 NOTE — ED Provider Notes (Signed)
Guardian feels comfortable taking patient home after discussion with social work. Will discharge home   Randall Gomez, Randall Tay, MD 03/10/17 [phone]

## 2017-03-10 NOTE — ED Notes (Signed)
Social worker Irine SealStephanie Carroll in to talk with pt. States she is going to talk to guardian
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
## 2018-06-07 ENCOUNTER — Encounter (HOSPITAL_COMMUNITY): Payer: Self-pay | Admitting: Emergency Medicine

## 2018-06-07 ENCOUNTER — Emergency Department (HOSPITAL_COMMUNITY)
Admission: EM | Admit: 2018-06-07 | Discharge: 2018-06-15 | Disposition: A | Discharge status: Home / Self Care | Payer: Medicaid Other | Attending: Emergency Medicine | Admitting: Emergency Medicine

## 2018-06-07 ENCOUNTER — Other Ambulatory Visit: Payer: Self-pay

## 2018-06-07 DIAGNOSIS — F259 Schizoaffective disorder, unspecified: Secondary | ICD-10-CM | POA: Insufficient documentation

## 2018-06-07 DIAGNOSIS — F419 Anxiety disorder, unspecified: Secondary | ICD-10-CM | POA: Insufficient documentation

## 2018-06-07 DIAGNOSIS — Z79899 Other long term (current) drug therapy: Secondary | ICD-10-CM | POA: Diagnosis not present

## 2018-06-07 DIAGNOSIS — F17220 Nicotine dependence, chewing tobacco, uncomplicated: Secondary | ICD-10-CM | POA: Insufficient documentation

## 2018-06-07 DIAGNOSIS — R410 Disorientation, unspecified: Secondary | ICD-10-CM | POA: Diagnosis present

## 2018-06-07 HISTORY — DX: Schizoaffective disorder, unspecified: F25.9

## 2018-06-07 LAB — COMPREHENSIVE METABOLIC PANEL
ALBUMIN: 4.2 g/dL (ref 3.5–5.0)
ALT: 32 U/L (ref 0–44)
ANION GAP: 7 (ref 5–15)
AST: 28 U/L (ref 15–41)
Alkaline Phosphatase: 59 U/L (ref 38–126)
BUN: 13 mg/dL (ref 6–20)
CHLORIDE: 104 mmol/L (ref 98–111)
CO2: 24 mmol/L (ref 22–32)
Calcium: 9.6 mg/dL (ref 8.9–10.3)
Creatinine, Ser: 0.77 mg/dL (ref 0.61–1.24)
GFR calc non Af Amer: >60 mL/min (ref >60)
Glucose, Bld: 99 mg/dL (ref 70–99)
Potassium: 4 mmol/L (ref 3.5–5.1)
SODIUM: 135 mmol/L (ref 135–145)
Total Bilirubin: 0.6 mg/dL (ref 0.3–1.2)
Total Protein: 8.4 g/dL — ABNORMAL HIGH (ref 6.5–8.1)

## 2018-06-07 LAB — RAPID URINE DRUG SCREEN, HOSP PERFORMED
AMPHETAMINES: NOT DETECTED
BENZODIAZEPINES: NOT DETECTED
Barbiturates: NOT DETECTED
COCAINE: NOT DETECTED
OPIATES: NOT DETECTED
TETRAHYDROCANNABINOL: NOT DETECTED

## 2018-06-07 LAB — CBC
HCT: 41.7 % (ref 39.0–52.0)
HEMOGLOBIN: 14.1 g/dL (ref 13.0–17.0)
MCH: 28.3 pg (ref 26.0–34.0)
MCHC: 33.8 g/dL (ref 30.0–36.0)
MCV: 83.6 fL (ref 78.0–100.0)
Platelets: 271 10*3/uL (ref 150–400)
RBC: 4.99 MIL/uL (ref 4.22–5.81)
RDW: 13.2 % (ref 11.5–15.5)
WBC: 8 10*3/uL (ref 4.0–10.5)

## 2018-06-07 LAB — ETHANOL: Alcohol, Ethyl (B): <10 mg/dL (ref <10)

## 2018-06-07 LAB — SALICYLATE LEVEL: Salicylate Lvl: <7 mg/dL (ref 2.8–30.0)

## 2018-06-07 LAB — ACETAMINOPHEN LEVEL
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
## 2018-06-07 NOTE — ED Notes (Signed)
Phone call to pt's Aunt number to inform of pt need to be picked up d/t pt has been discharged.
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
## 2018-06-07 NOTE — ED Notes (Addendum)
Aunt called x1. Call goes straight to voicemail. . Voicemail left.

## 2018-06-07 NOTE — ED Notes (Signed)
Attempt to call aunt x2

## 2018-06-07 NOTE — Discharge Instructions (Addendum)
Take the prescriptions as directed.  Call your regular medical doctor and the mental health provider tomorrow to schedule a follow up appointment within the next week.  Return to the Emergency Department immediately sooner if worsening.
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
## 2018-06-08 MED ORDER — BENZTROPINE MESYLATE 1 MG PO TABS
0.5000 mg | ORAL_TABLET | Freq: Two times a day (BID) | ORAL | Status: DC
  Administered 2018-06-08 – 2018-06-15 (×13): 0.5 mg via ORAL
  Filled 2018-06-08 (×13): qty 1

## 2018-06-08 MED ORDER — ACETAMINOPHEN 325 MG PO TABS: 650.0000 mg | ORAL_TABLET | ORAL | Status: DC | PRN

## 2018-06-08 MED ORDER — ONDANSETRON HCL 4 MG PO TABS: 4.0000 mg | ORAL_TABLET | Freq: Three times a day (TID) | ORAL | Status: DC | PRN

## 2018-06-08 MED ORDER — HALOPERIDOL 0.5 MG PO TABS
0.5000 mg | ORAL_TABLET | Freq: Two times a day (BID) | ORAL | Status: DC
  Administered 2018-06-08 – 2018-06-15 (×13): 0.5 mg via ORAL
  Filled 2018-06-08 (×15): qty 1

## 2018-06-08 NOTE — ED Notes (Signed)
Per APS, pt is established with a Child psychotherapistsocial worker through [facility], Rojelio BrennerJessica Webb.  Her number is [phone].  Called SW and left message for her to call back.
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
## 2018-06-08 NOTE — NC FL2 (Signed)
  [place] MEDICAID FL2 LEVEL OF CARE SCREENING TOOL     IDENTIFICATION  Patient Name: Randall Gomez Birthdate: [birth year] Sex: male Admission Date (Current Location): 06/07/2018  [facility] [place]  [place]   Facility and Address:  [facility],  [address], [place] [id number]      Provider Number: [id number]  Attending Physician Name and Address:  Default, Provider, MD  Relative Name and Phone Number:       Current Level of Care: Other (Comment)(Emergency Department) Recommended Level of Care: Assisted Living Facility, Family Care Home Prior Approval Number:    Date Approved/Denied:   PASRR Number:    Discharge Plan: Other (Comment)(ALF/FCH)    Current Diagnoses: Patient Active Problem List   Diagnosis Date Noted  . History of mental retardation 03/09/2017    Orientation RESPIRATION BLADDER Height & Weight     Self, Time, Situation, Place  Normal Incontinent Weight: 244 lb 14.9 oz (111.1 kg) Height:     BEHAVIORAL SYMPTOMS/MOOD NEUROLOGICAL BOWEL NUTRITION STATUS      Continent Diet(Regular)  AMBULATORY STATUS COMMUNICATION OF NEEDS Skin   Independent Verbally Normal                       Personal Care Assistance Level of Assistance  Bathing, Feeding, Dressing Bathing Assistance: Independent Feeding assistance: Independent Dressing Assistance: Independent     Functional Limitations Info  Sight, Hearing, Speech Sight Info: Adequate Hearing Info: Adequate Speech Info: Adequate    SPECIAL CARE FACTORS FREQUENCY                       Contractures Contractures Info: Not present    Additional Factors Info  Code Status, Allergies, Psychotropic Code Status Info: Full Code Allergies Info: NKA Psychotropic Info: Cogentin, Haldol, Haldol Decanoate         Current Medications (06/08/2018):  This is the current hospital active medication list No current facility-administered medications for this encounter.     Current Outpatient Medications  Medication Sig Dispense Refill  . benztropine (COGENTIN) 0.5 MG tablet Take 0.5 mg by mouth 2 (two) times daily.     . haloperidol (HALDOL) 0.5 MG tablet Take 0.5 mg by mouth 2 (two) times daily.   0  . haloperidol decanoate (HALDOL DECANOATE) 100 MG/ML injection Inject 0.5 mLs into the muscle every 28 (twenty-eight) days.       Discharge Medications: Please see discharge summary for a list of discharge medications.  Relevant Imaging Results:  Relevant Lab Results:   Additional Information    Marvion Bastidas, Juleen ChinaHeather D, LCSW

## 2018-06-08 NOTE — ED Notes (Signed)
Pt provided snack and beverage 

## 2018-06-08 NOTE — Clinical SW OB High Risk (Addendum)
LCSW spoke with Rojelio BrennerJessica Webb, [facility] Care Coordinator, who indicated that she spoke with her supervisor and that patient would need be be referred to any ALF or [facility] who would [facility] him. She indicated that if a group home accepted him, it would have to be for his check. She stated that she would also be checking in to facilities that may potentially offer a bed.  FL2 completed and referrals sent to all ALF's and FCH's in the HUB.   Kevis Qu, Juleen ChinaHeather D, LCSW
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
## 2018-06-09 NOTE — ED Notes (Signed)
Representative for [facility] here to assess patient.

## 2018-06-09 NOTE — ED Notes (Signed)
Patient given meal tray. Patient comfortable at this time.

## 2018-06-09 NOTE — Clinical Social Work Note (Signed)
LCSW spoke with patient's aunt, Byrd HesselbachMaria, who stated that patient has lived with her for almost 13 years. She stated that he monthly check is apporoximately 771.00  Harvest Darknthony [facility]her [facility]  [place], [place], [phone], stated that he would come today to assess patient about 1:30 p.m.    Elison Worrel, Juleen ChinaHeather D, LCSW

## 2018-06-09 NOTE — Clinical Social Work Note (Signed)
LCSW left a message for Randall LowerYolanda Gomez requesting return contact related to potential placement with [facility].     Randall Gomez, Randall ChinaHeather D, LCSW

## 2018-06-10 NOTE — ED Notes (Signed)
Pt provided sprite and graham cracker snack. Pt cooperating watching TV sitting on bedside.

## 2018-06-11 NOTE — ED Notes (Signed)
Pt took shower °
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
## 2018-06-12 NOTE — Clinical Social Work Note (Signed)
LCSW spoke with Cheryln ManlyJessica Parker at [facility] in regards to a bed offer for patient. Ms. [facility]y [facility] indicated that she could only take patient if he was in the [employer] Waiver or if he was B3B1.  LCSW left a message for Ted McalpineJennifer Webb, care coordinator, inquiring about patient being in the innovation waiver or B3B1.    Paydon Carll, Juleen ChinaHeather D, LCSW

## 2018-06-13 NOTE — Clinical Social Work Note (Signed)
[facility].   Message left for [facility] requesting return contact related to making a report.    Haille Pardi, Juleen ChinaHeather D, LCSW

## 2018-06-13 NOTE — Clinical Social Work Note (Addendum)
LCSW spoke with Ted McalpineJennifer Webb, [facility] Care Coordinator. Victorino DikeJennifer stated that patient is on the [employer] wait list and has been since 2013 (wait list is about 10 years long). Patient can receive and is eligible to receive B3 services.  Victorino DikeJennifer stated that a case specific agreement would take 1-2 months so it would be quicker for patient to [facility] to a [facility] or [facility]F.  She stated that patient has been on the adult residential placement wait list since 2018.  She stated that it is an extensive process.  It was discussed that DSS/APS would need to be contacted regarding patient's guardian being unable to provide continued care for patient.   She did indicate that patient's IDD level is moderate.   LCSW to make APS referral.     Edgard Debord, Juleen ChinaHeather D, LCSW

## 2018-06-14 NOTE — ED Provider Notes (Signed)
Patient awaiting placement.  He has been followed by social work for placement.  No acute issues at this time. BP (!) 155/105 (BP Location: Left Wrist)   Pulse 83   Temp (!) 97.5 F (36.4 C) (Oral)   Resp 18   Wt 111.1 kg   SpO2 100%   BMI 33.22 kg/m     Zadie Rhine, MD 06/14/18 [phone]

## 2018-06-14 NOTE — Clinical Social Work Note (Signed)
Harvest Dark, [facility] in [place], [place], stated that he will admit patient to his facility on tomorrow. 06/15/18.    Atwell Mcdanel, Juleen China, LCSW

## 2018-06-15 MED ORDER — HALOPERIDOL 0.5 MG PO TABS: 0.5000 mg | ORAL_TABLET | Freq: Two times a day (BID) | ORAL | 0 refills | Status: DC

## 2018-06-15 MED ORDER — BENZTROPINE MESYLATE 0.5 MG PO TABS: 0.5000 mg | ORAL_TABLET | Freq: Two times a day (BID) | ORAL | 0 refills | Status: DC

## 2018-06-15 NOTE — ED Notes (Signed)
[facility] here to pick up patient, Dr Clarene Duke notified to d/c patient

## 2018-06-15 NOTE — NC FL2 (Signed)
  Randall Gomez MEDICAID FL2 LEVEL OF CARE SCREENING TOOL     IDENTIFICATION  Patient Name: Randall Gomez Birthdate: [birth year] Sex: male Admission Date (Current Location): 06/07/2018  [facility] and [place] Number:  Randall Gomez and Address:  Randall Gomez,  [address] Randall Gomez [address]      Randall Gomez Number: [phone]  Attending Physician Name and Address:  Randall Gomez, Provider, Randall Gomez  Relative Name and Phone Number:       Current Level of Care: Other (Comment)(Emergency Department) Recommended Level of Care: Family Care Home Prior Approval Number:    Date Approved/Denied:   PASRR Number:    Discharge Plan: Other (Comment)(FCH)    Current Diagnoses: Patient Active Problem List   Diagnosis Date Noted  . History of mental retardation 03/09/2017    Orientation RESPIRATION BLADDER Height & Weight     Self, Time, Situation, Place  Normal Incontinent Weight: 244 lb 14.9 oz (111.1 kg) Height:     BEHAVIORAL SYMPTOMS/MOOD NEUROLOGICAL BOWEL NUTRITION STATUS      Continent Diet(Regular)  AMBULATORY STATUS COMMUNICATION OF NEEDS Skin   Independent Verbally Normal                       Personal Care Assistance Level of Assistance  Bathing, Feeding, Dressing Bathing Assistance: Independent Feeding assistance: Independent Dressing Assistance: Independent     Functional Limitations Info  Sight, Hearing, Speech Sight Info: Adequate Hearing Info: Adequate Speech Info: Adequate    SPECIAL CARE FACTORS FREQUENCY                       Contractures Contractures Info: Not present    Additional Factors Info  Code Status, Allergies, Psychotropic Code Status Info: Full Code Allergies Info: NKA Psychotropic Info: Cogentin, Haldol, Haldol Decanoate         Current Medications (06/15/2018):  This is the current hospital active medication list Current Facility-Administered Medications  Medication Dose Route Frequency Randall Gomez Last Rate Last Dose   . acetaminophen (TYLENOL) tablet 650 mg  650 mg Oral Q4H PRN Randall Mulders, Randall Gomez      . benztropine (COGENTIN) tablet 0.5 mg  0.5 mg Oral BID Randall Mulders, Randall Gomez   0.5 mg at 06/15/18 0900  . haloperidol (HALDOL) tablet 0.5 mg  0.5 mg Oral BID Randall Mulders, Randall Gomez   0.5 mg at 06/15/18 0900  . ondansetron (ZOFRAN) tablet 4 mg  4 mg Oral Q8H PRN Randall Mulders, Randall Gomez       Current Outpatient Medications  Medication Sig Dispense Refill  . benztropine (COGENTIN) 0.5 MG tablet Take 0.5 mg by mouth 2 (two) times daily.     . haloperidol (HALDOL) 0.5 MG tablet Take 0.5 mg by mouth 2 (two) times daily.   0  . haloperidol decanoate (HALDOL DECANOATE) 100 MG/ML injection Inject 0.5 mLs into the muscle every 28 (twenty-eight) days.       Discharge Medications: Please see discharge summary for a list of discharge medications.  Relevant Imaging Results:  Relevant Lab Results:   Additional Information    Randall Gomez, Randall China, Randall Gomez

## 2018-06-15 NOTE — Clinical Social Work Note (Signed)
Message left for Harvest Dark with [facility] in regards to patient being admitted in to his facility as discussed on yesterday. Return contact requested.      Michol Emory, Juleen China, LCSW

## 2018-06-15 NOTE — ED Notes (Signed)
Given meal tray.

## 2018-06-15 NOTE — ED Notes (Signed)
Pt provided sprite zero and crackers w/ peanut butter for a snack.

## 2018-06-15 NOTE — ED Notes (Signed)
Given crackers and water ?

## 2018-06-15 NOTE — ED Notes (Signed)
Lunch tray given. 

## 2018-06-15 NOTE — Clinical Social Work Note (Signed)
LCSW spoke with Kym Groom with [facility] DSS/APS. She stated that this was not an APS issue it was a [employer] issue. She stated that [employer] needed to file a modification for guardianship stating the issues that the guardian had which deemed her inappropriate. She stated that she would sent LCSW a letter related to the case not being accepted.    Nox Talent, Juleen China, LCSW

## 2018-06-16 NOTE — Clinical Social Work Note (Signed)
Received a call today from [facility] requesting additional information on pt. Provided information and completed RSVP referral. Also spoke with pt's aunt by phone to update.

## 2018-10-10 ENCOUNTER — Emergency Department: Payer: Medicaid Other

## 2018-10-10 ENCOUNTER — Other Ambulatory Visit: Payer: Self-pay

## 2018-10-10 ENCOUNTER — Encounter: Payer: Self-pay | Admitting: Intensive Care

## 2018-10-10 ENCOUNTER — Emergency Department
Admission: EM | Admit: 2018-10-10 | Discharge: 2018-10-10 | Disposition: A | Discharge status: Home / Self Care | Payer: Medicaid Other | Attending: Emergency Medicine | Admitting: Emergency Medicine

## 2018-10-10 DIAGNOSIS — F17228 Nicotine dependence, chewing tobacco, with other nicotine-induced disorders: Secondary | ICD-10-CM | POA: Insufficient documentation

## 2018-10-10 DIAGNOSIS — R079 Chest pain, unspecified: Secondary | ICD-10-CM

## 2018-10-10 DIAGNOSIS — J181 Lobar pneumonia, unspecified organism: Secondary | ICD-10-CM | POA: Insufficient documentation

## 2018-10-10 DIAGNOSIS — J189 Pneumonia, unspecified organism: Secondary | ICD-10-CM

## 2018-10-10 HISTORY — DX: Hepatitis a without hepatic coma: B15.9

## 2018-10-10 HISTORY — DX: Congenital malformation of heart, unspecified: Q24.9

## 2018-10-10 HISTORY — DX: Cardiac murmur, unspecified: R01.1

## 2018-10-10 HISTORY — DX: Unspecified viral hepatitis B without hepatic coma: B19.10

## 2018-10-10 LAB — CBC
HCT: 44.6 % (ref 39.0–52.0)
HEMOGLOBIN: 14.6 g/dL (ref 13.0–17.0)
MCH: 27.3 pg (ref 26.0–34.0)
MCHC: 32.7 g/dL (ref 30.0–36.0)
MCV: 83.4 fL (ref 80.0–100.0)
Platelets: 268 10*3/uL (ref 150–400)
RBC: 5.35 MIL/uL (ref 4.22–5.81)
RDW: 14 % (ref 11.5–15.5)
WBC: 7.5 10*3/uL (ref 4.0–10.5)
nRBC: 0 % (ref 0.0–0.2)

## 2018-10-10 LAB — BASIC METABOLIC PANEL
ANION GAP: 5 (ref 5–15)
BUN: 12 mg/dL (ref 6–20)
CHLORIDE: 109 mmol/L (ref 98–111)
CO2: 25 mmol/L (ref 22–32)
CREATININE: 0.97 mg/dL (ref 0.61–1.24)
Calcium: 9.2 mg/dL (ref 8.9–10.3)
GFR calc non Af Amer: >60 mL/min (ref >60)
Glucose, Bld: 104 mg/dL — ABNORMAL HIGH (ref 70–99)
POTASSIUM: 3.6 mmol/L (ref 3.5–5.1)
SODIUM: 139 mmol/L (ref 135–145)

## 2018-10-10 LAB — TROPONIN I

## 2018-10-10 MED ORDER — AZITHROMYCIN 250 MG PO TABS: 250.0000 mg | ORAL_TABLET | Freq: Every day | ORAL | 0 refills | Status: DC

## 2018-10-10 MED ORDER — ASPIRIN 81 MG PO CHEW
324.0000 mg | CHEWABLE_TABLET | Freq: Once | ORAL | Status: AC
  Administered 2018-10-10: 324 mg via ORAL
  Filled 2018-10-10: qty 4

## 2018-10-10 MED ORDER — AZITHROMYCIN 500 MG PO TABS
500.0000 mg | ORAL_TABLET | Freq: Once | ORAL | Status: AC
  Administered 2018-10-10: 500 mg via ORAL
  Filled 2018-10-10: qty 1

## 2018-10-10 NOTE — ED Notes (Signed)
Spoke with Lab and the troponin that was just ran was drawn over 2 hours ago.  I drew an additional one while starting IV and will send this one down now instead of waiting until 2pm as it has been 2 hours.  EDP aware.

## 2018-10-10 NOTE — ED Triage Notes (Signed)
Patient arrived POV from [facility] care in [place]. C/o intermittent chest pains with no radiation since last night. Just seen at MD and referred to Dr Lennette Bihari.

## 2018-10-10 NOTE — ED Provider Notes (Signed)
[facility] Emergency Department Provider Note  ____________________________________________  Time seen: Approximately 11:53 AM  I have reviewed the triage vital signs and the nursing notes.   HISTORY  Chief Complaint Chest Pain    HPI Randall Gomez is a 48 y.o. male with a history of mental retardation and schizoaffective disorder, history of heart murmur, presenting for chest pain.  The patient is unable to give me any history due to his MR, but he is accompanied by his group home caregiver who is able to add some history.  Per report, prior to breakfast this morning, the patient complained of some central chest pain while at rest.  It then resolved after several minutes and was not associated with any obvious shortness of breath, diaphoresis, nausea or vomiting, lightheadedness or syncope.  The patient's caregiver states that he has been having chest pain for over a week and was seen by his PMD last week, and "all the tests were normal," and the plan was for outpatient follow-up with cardiology, Dr. Lennette Bihari.  He has not had any of her stratification studies including stress test or cardiac catheterization.  Upon questioning, the patient mostly denies having any chest pain but sometimes reports a "slight" pain that "goes away on its own."  Past Medical History:  Diagnosis Date  . Heart defect   . Heart murmur   . Hepatitis A   . Hepatitis B   . Mental retardation   . Schizoaffective disorder [facility])     Patient Active Problem List   Diagnosis Date Noted  . History of mental retardation 03/09/2017    History reviewed. No pertinent surgical history.  Current Outpatient Rx  . Order #: [id number] Class: Historical Med  . Order #: [id number] Class: Historical Med  . [START ON 10/11/2018] Order #: [id number] Class: Print  . Order #: [id number] Class: Historical Med  . Order #: [id number] Class: Print  . Order #: [id number] Class: Historical Med  . Order #: [id number] Class:  Print  . Order #: [id number] Class: Historical Med    Allergies Patient has no known allergies.  Family History  Problem Relation Age of Onset  . Diabetes Other   . Hypertension Other     Social History Social History   Tobacco Use  . Smoking status: Never Smoker  . Smokeless tobacco: Current User    Types: Chew  Substance Use Topics  . Alcohol use: No  . Drug use: No    Review of Systems Constitutional: No fever/chills.  No lightheadedness or syncope. Eyes: No visual changes. ENT: No sore throat. No congestion or rhinorrhea. Cardiovascular: Positive chest pain. Denies palpitations. Respiratory: Denies shortness of breath.  No cough. Gastrointestinal: No abdominal pain.  No nausea, no vomiting.  No diarrhea.  No constipation. Genitourinary: Negative for dysuria. Musculoskeletal: Negative for back pain.  No lower extremity swelling or calf pain. Skin: Negative for rash. Neurological: Negative for headaches. No focal numbness, tingling or weakness.     ____________________________________________   PHYSICAL EXAM:  VITAL SIGNS: ED Triage Vitals [10/10/18 0915]  Enc Vitals Group     BP (!) 143/80     Pulse Rate 79     Resp 16     Temp (!) 97.5 F (36.4 C)     Temp Source Oral     SpO2 98 %     Weight      Height 6' (1.829 m)     Head Circumference      Peak Flow      Pain Score 0  Pain Loc      Pain Edu?      Excl. in GC?     Constitutional: Patient is alert and answers some questions.  Mentally retarded.  GCS is 15. Eyes: Conjunctivae are normal.  EOMI. No scleral icterus. Head: Atraumatic. Nose: No congestion/rhinnorhea. Mouth/Throat: Mucous membranes are moist.  Neck: No stridor.  Supple.  No JVD.  No meningismus. Cardiovascular: Normal rate, regular rhythm. + holosystolic murmurs, no rubs or gallops.  Respiratory: Normal respiratory effort.  No accessory muscle use or retractions. Lungs CTAB.  No wheezes, rales or ronchi. Gastrointestinal:  Soft, nontender and nondistended.  No guarding or rebound.  No peritoneal signs. Musculoskeletal: No LE edema. No ttp in the calves or palpable cords.  Negative Homan's sign. Neurologic: Alert.  Speech is clear.  Face and smile are symmetric.  EOMI.  Moves all extremities well. Skin:  Skin is warm, dry and intact. No rash noted. Psychiatric: Mood is normal.  ____________________________________________   LABS (all labs ordered are listed, but only abnormal results are displayed)  Labs Reviewed  BASIC METABOLIC PANEL - Abnormal; Notable for the following components:      Result Value   Glucose, Bld 104 (*)    All other components within normal limits  CBC  TROPONIN I  TROPONIN I   ____________________________________________  EKG  ED ECG REPORT I, Anne-Caroline Sharma Covert, the attending physician, personally viewed and interpreted this ECG.   Date: 10/10/2018  EKG Time: 919  Rate: 83  Rhythm: normal sinus rhythm; RBBB  Axis: normal  Intervals:none  ST&T Change: No STEMI  ____________________________________________  RADIOLOGY  Dg Chest 2 View  Result Date: 10/10/2018 CLINICAL DATA:  Intermittent right chest pain EXAM: CHEST - 2 VIEW COMPARISON:  April 19, 2013 FINDINGS: The heart size and mediastinal contours are within normal limits. Mild patchy opacity of right lung base is identified. There is no pulmonary edema or pleural effusion. The visualized skeletal structures are unremarkable. IMPRESSION: Mild patchy opacity of right lung base, developing pneumonia is not excluded. Electronically Signed   By: Sherian Rein M.D.   On: 10/10/2018 12:26    ____________________________________________   PROCEDURES  Procedure(s) performed: None  Procedures  Critical Care performed: No ____________________________________________   INITIAL IMPRESSION / ASSESSMENT AND PLAN / ED COURSE  Pertinent labs & imaging results that were available during my care of the patient were  reviewed by me and considered in my medical decision making (see chart for details).  48 y.o. male with a history of mental retardation presenting with over a week of intermittent atypical episodes of chest pain.  The patient is unable to give any significant history.  He does have a murmur with on my examination.  He is hemodynamically stable.  I will plan to do troponin x2 with a chest x-ray.  His EKG is reassuring without any evidence of ischemia or arrhythmia.  This patient has never had a wrist edification study and given his age and inability to give more details, it is not unreasonable for him to be seen by cardiology and I spoke with Dr. Alinda Sierras, who will see him as an outpatient at 10:30 AM if the remainder of his ED work-up is reassuring.  A GI cause for the patient's symptoms is also possible although it does not appear that he is related to food.  PE or aortic pathology are also considered but very unlikely.  Plan reevaluation for final disposition.  ----------------------------------------- 12:46 PM on 10/10/2018 -----------------------------------------  The  patient's repeat troponin is negative.  He has an appointment to see Dr. Lennette BihariKohn tomorrow.  His chest x-ray does show an opacity in the right lower lung.  The patient has not been complaining of any cough and has had no fevers; his white blood cell count is normal.  However given his cognitive impairment, I feel it is safe for her to treat him for early pneumonia by given him a Z-Pak, rather than waiting for development of worsening pneumonia which would be harder to treat.  So I have initiated azithromycin here and he will go home with a prescription for 4 more days starting tomorrow.  At this time, the patient is safe for discharge home.  Return precautions as well as follow-up instructions were discussed with the patient and his caregiver  ____________________________________________  FINAL CLINICAL IMPRESSION(S) / ED  DIAGNOSES  Final diagnoses:  Community acquired pneumonia of right lower lobe of lung (HCC)  Chest pain, unspecified type         NEW MEDICATIONS STARTED DURING THIS VISIT:  New Prescriptions   AZITHROMYCIN (ZITHROMAX Z-PAK) 250 MG TABLET    Take 1 tablet (250 mg total) by mouth daily.      Rockne MenghiniNorman, Anne-Caroline, MD 10/10/18 1247

## 2018-10-10 NOTE — ED Notes (Signed)
Patient transported to X-ray 

## 2018-10-10 NOTE — Discharge Instructions (Addendum)
Please go to your appointment with Dr. Lennette BihariKohn, the cardiologist, tomorrow at 10:30 AM for further evaluation of your chest pain.  Your chest x-ray showed no abnormality which may be related to an early infection.  Please take the entire course of azithromycin, which is an antibiotic, even if you are feeling better.  Make an appointment with your primary care physician for follow-up.  Return to the emergency department if you develop severe pain, lightheadedness or fainting, fever, shortness of breath, or any other symptoms concerning to you.

## 2019-05-04 ENCOUNTER — Ambulatory Visit (LOCAL_COMMUNITY_HEALTH_CENTER): Payer: Self-pay | Admitting: Nurse Practitioner

## 2019-05-04 DIAGNOSIS — Z111 Encounter for screening for respiratory tuberculosis: Secondary | ICD-10-CM

## 2019-12-22 IMAGING — CR DG CHEST 2V
2 series · 2 of 2 positions shown · non-contrast
Comparison: April 19, 2013

CLINICAL DATA: Intermittent right chest pain

EXAM:
CHEST - 2 VIEW

[chest pa]
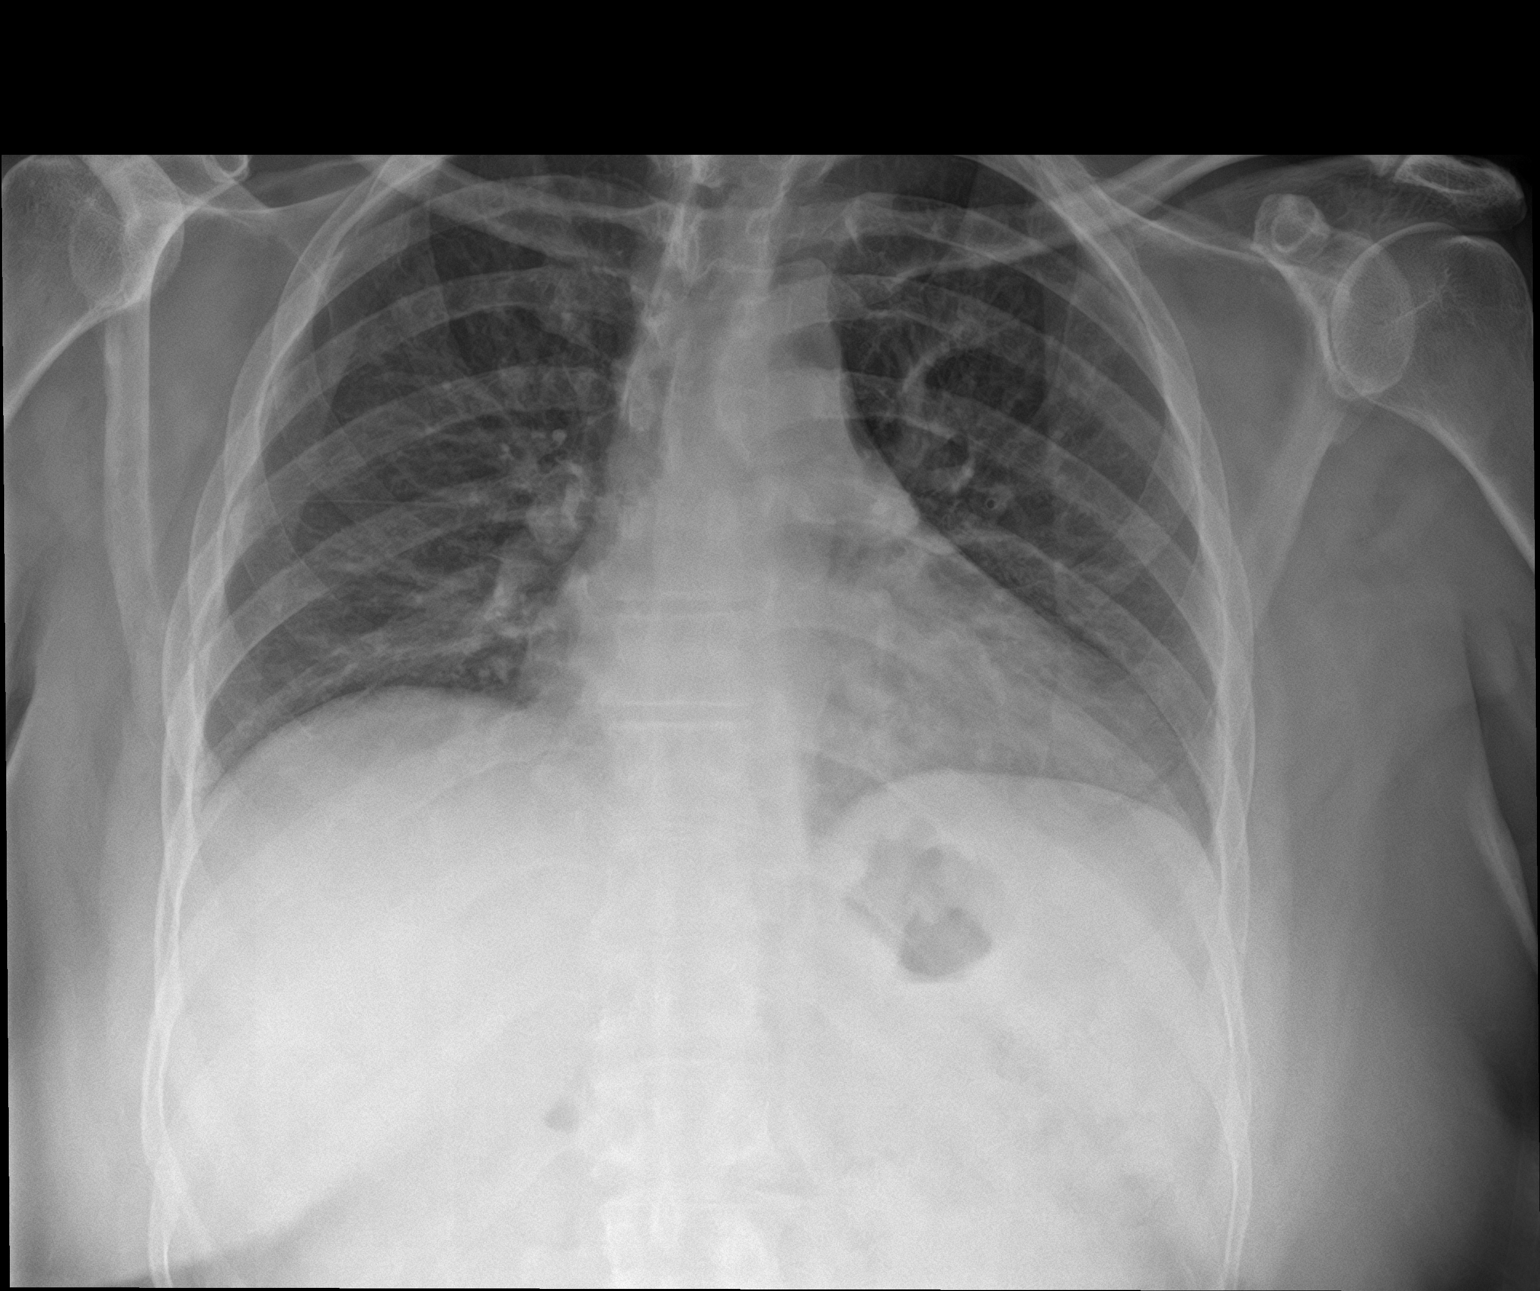

[chest lat]
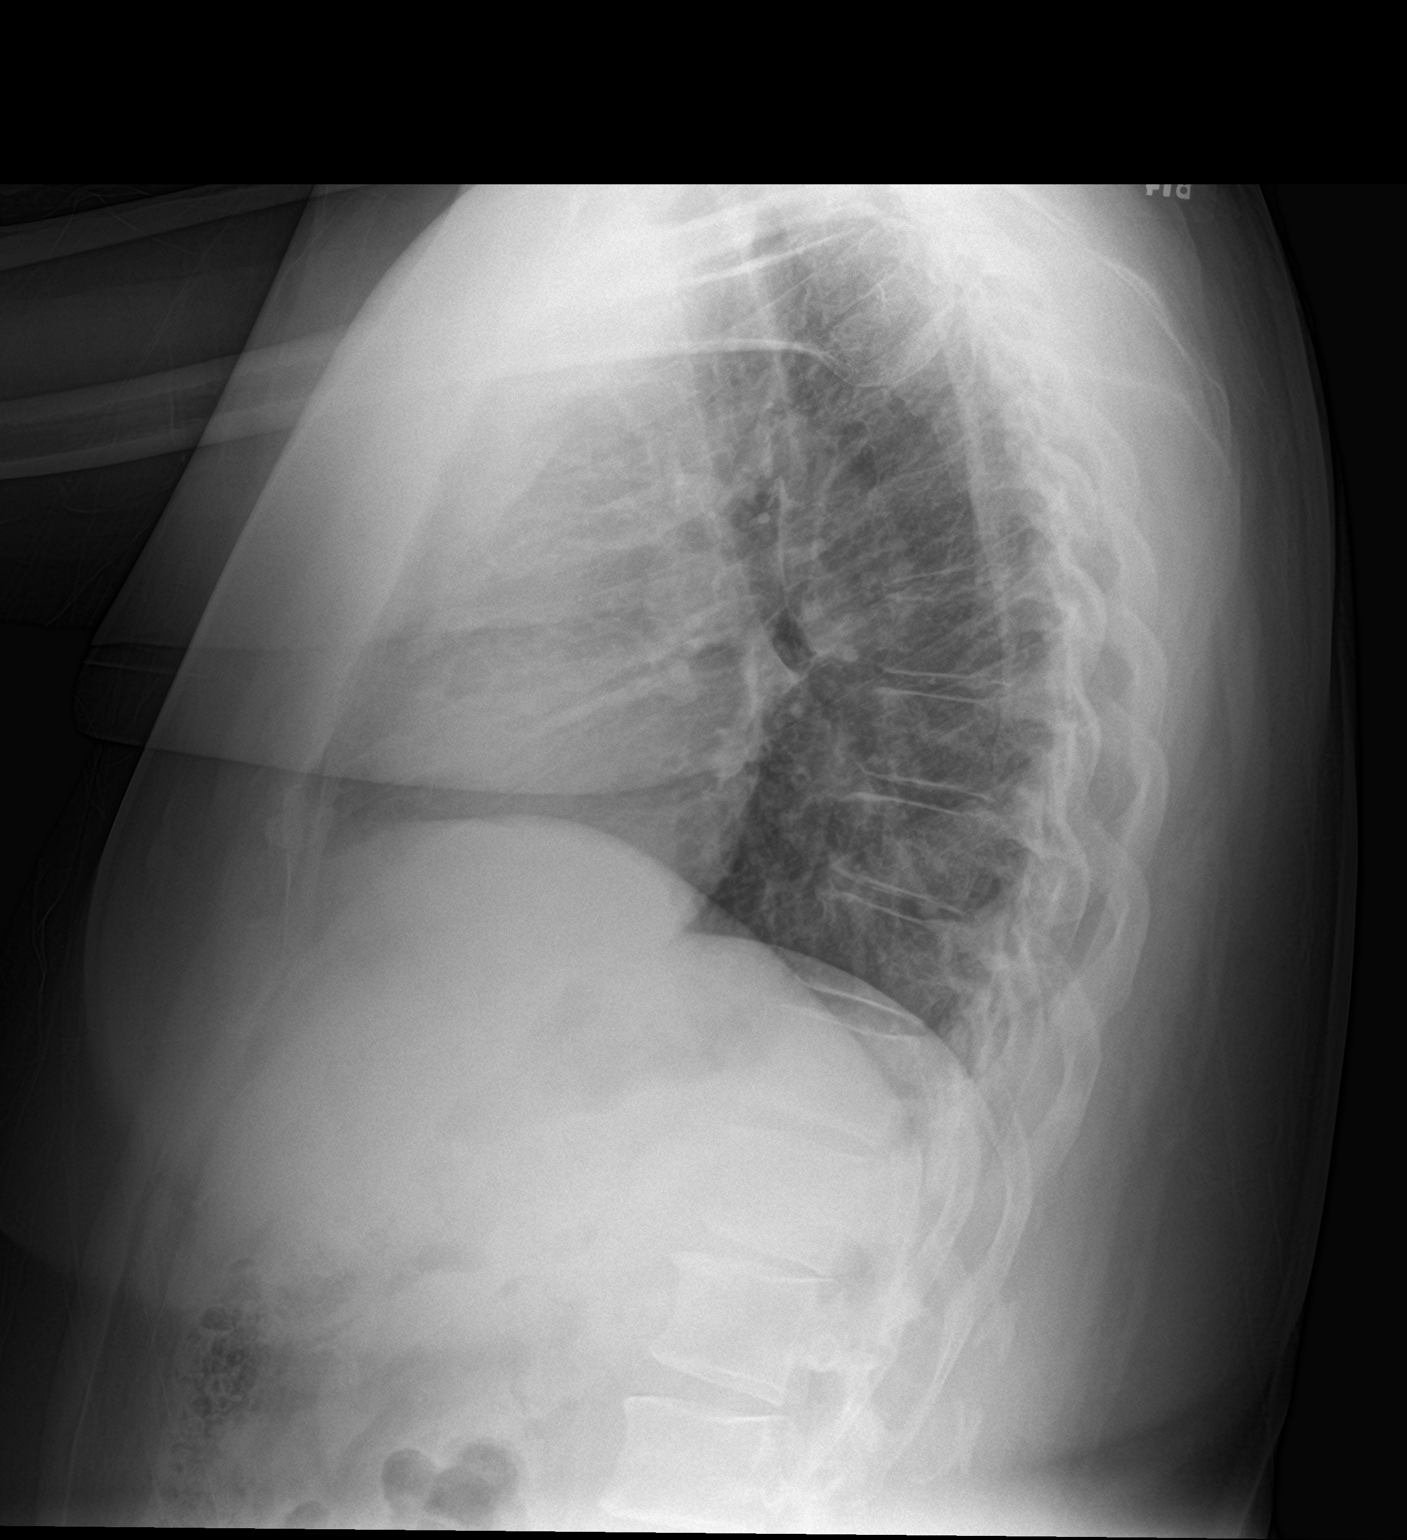

[2 of 2 positions shown; findings below may reference images not displayed]

FINDINGS: The heart size and mediastinal contours are within normal limits.
Mild patchy opacity of right lung base is identified. There is no
pulmonary edema or pleural effusion. The visualized skeletal
structures are unremarkable.
IMPRESSION: Mild patchy opacity of right lung base, developing pneumonia is not
excluded.

## 2021-05-06 ENCOUNTER — Encounter: Payer: Self-pay | Admitting: Surgery

## 2021-05-06 ENCOUNTER — Other Ambulatory Visit: Payer: Self-pay

## 2021-05-06 ENCOUNTER — Ambulatory Visit (INDEPENDENT_AMBULATORY_CARE_PROVIDER_SITE_OTHER): Payer: Medicaid Other | Admitting: Surgery

## 2021-05-06 VITALS — BP 134/89 | HR 97 | Temp 98.9°F | Wt 249.8 lb

## 2021-05-06 DIAGNOSIS — K8020 Calculus of gallbladder without cholecystitis without obstruction: Secondary | ICD-10-CM | POA: Diagnosis not present

## 2021-05-06 NOTE — Progress Notes (Signed)
05/06/2021  Reason for Visit:  Cholelithiasis  Referring Provider:  Franco Nones, FNP  History of Present Illness: Randall Gomez is a 50 y.o. male presenting today for evaluation of cholelithiasis.  The patient is in a group home and has a history of mental retardation and schizoaffective disorder.  Per the patient's notes that were faxed over to the office, abdominal ultrasound was done on 04/21/2021 and on review of systems there was note of nausea and vomiting.  Ultrasound done showed cholelithiasis with no evidence of cholecystitis.  There is no images for me to be able to see but the report was faxed over as well.  When asking the patient, I am unable to obtain any particular history from him he denies having any abdominal pain, any nausea, or any vomiting.  However his caregiver is present with him during this visit and he reports that he had an episode of emesis but he thinks was related to swallowing tobacco.  He does not have any recollection of any abdominal pain or other issues.  Past Medical History: Past Medical History:  Diagnosis Date   Heart defect    Heart murmur    Hepatitis A    Hepatitis B    Mental retardation    Schizoaffective disorder (HCC)      Past Surgical History: -Patient denies any surgeries.  Home Medications: Prior to Admission medications   Medication Sig Start Date End Date Taking? Authorizing Provider  albuterol (PROAIR HFA) 108 (90 Base) MCG/ACT inhaler Inhale into the lungs every 6 (six) hours as needed for wheezing or shortness of breath.   Yes [provider]  aspirin EC 81 MG tablet Take 81 mg by mouth daily. Swallow whole.   Yes [provider]  atorvastatin (LIPITOR) 20 MG tablet Take 20 mg by mouth daily.   Yes [provider]  benztropine (COGENTIN) 1 MG tablet Take 1 mg by mouth 2 (two) times daily.   Yes [provider]  cabergoline (DOSTINEX) 0.5 MG tablet Take 0.25 mg by mouth 2 (two) times a week.   Yes  [provider]  cetirizine (ZYRTEC) 10 MG tablet Take 10 mg by mouth daily.   Yes [provider]  haloperidol (HALDOL) 10 MG tablet Take 10 mg by mouth 2 (two) times daily.   Yes [provider]  methimazole (TAPAZOLE) 5 MG tablet Take 5 mg by mouth 3 (three) times daily.   Yes [provider]  Vitamin D, Ergocalciferol, (DRISDOL) 1.25 MG (50000 UNIT) CAPS capsule Take 50,000 Units by mouth every 7 (seven) days.   Yes [provider]    Allergies: No Known Allergies  Social History:  reports that he has never smoked. His smokeless tobacco use includes chew. He reports that he does not drink alcohol and does not use drugs.   Family History: Family History  Problem Relation Age of Onset   Diabetes Other    Hypertension Other     Review of Systems: Review of Systems  Constitutional:  Negative for chills and fever.  Respiratory:  Negative for shortness of breath.   Cardiovascular:  Negative for chest pain.  Gastrointestinal:  Negative for abdominal pain, constipation, diarrhea, nausea and vomiting.   Physical Exam BP 134/89   Pulse 97   Temp 98.9 F (37.2 C) (Oral)   Wt 249 lb 12.8 oz (113.3 kg)   SpO2 99%   BMI 33.88 kg/m  CONSTITUTIONAL: No acute distress HEENT:  Normocephalic, atraumatic, extraocular motion intact. RESPIRATORY:  Lungs are clear, and breath sounds are equal bilaterally. Normal respiratory effort without pathologic use of accessory muscles. CARDIOVASCULAR: Heart is regular without murmurs, gallops, or rubs. GI: The abdomen is soft, obese, nondistended, with no tenderness to palpation in any of the quadrants.  No abdominal wall hernias.  MUSCULOSKELETAL:  Normal muscle strength and tone in all four extremities.  No peripheral edema or cyanosis. NEUROLOGIC:  Motor and sensation is grossly normal.  Cranial nerves are grossly intact.  Laboratory Analysis: Labs from 02/26/2021: Sodium 138, potassium 4.0, chloride 104,  carbon dioxide 20, BUN 14, creatinine 1.1.  Total bilirubin 0.2, AST 17, ALT 20, alkaline phosphatase 96.  WBC 10.3, hemoglobin 13.5, hematocrit 42.8, platelets 274.  Imaging: Abdominal ultrasound on 04/21/2021: IMPRESSION: 1.  Cholelithiasis without evidence of bile duct obstruction. 2.  Simple left renal parapelvic cyst.  Assessment and Plan: This is a 50 y.o. male with cholelithiasis.  - Discussed with the patient and his caregiver that is present with him that does not appear that he is has been having any issues with abdominal pain although it is unclear given the patient's mental condition.  There is mention of emesis but the caregiver thinks this was related to swallowing his tobacco chew.  Given the uncertainty of since symptoms and the true triggers on this, he may just be having incidental cholelithiasis.  Discussed with the patient and his caregiver that there is a certain percentage of the population that has cholelithiasis but not everyone of them has symptoms from them.  At this point, I do not have any convincing evidence that he requires any surgical intervention.  As such, I have recommended watchful waiting for now.  Discussed with him and the caregiver regarding particular possible triggers for biliary colic including greasy or fatty foods.  As such, I recommended also maintaining a low-fat diet as well as keeping a closer eye on his symptoms and any particular triggers preceding them.  He will follow-up with me in 1 month to reassess.  Face-to-face time spent with the patient and care providers was 40 minutes, with more than 50% of the time spent counseling, educating, and coordinating care of the patient.     Howie Ill, MD [facility]

## 2021-05-06 NOTE — Patient Instructions (Signed)
If you have any concerns or questions, please feel free to call our office. See follow up appointment below.  Cholelithiasis  Cholelithiasis happens when gallstones form in the gallbladder. The gallbladder stores bile. Bile is a fluid that helps digest fats. Bile can harden and form into gallstones. If they cause a blockage, they can cause pain (gallbladder attack). What are the causes? This condition may be caused by: Some blood diseases, such as sickle cell anemia. Too much of a fat-like substance (cholesterol) in your bile. Not enough bile salts in your bile. These salts help the body absorb and digest fats. The gallbladder not emptying fully or often enough. This is common in pregnant women. What increases the risk? The following factors may make you more likely to develop this condition: Being male. Being pregnant many times. Eating a lot of fried foods, fat, and refined carbs (refined carbohydrates). Being very overweight (obese). Being older than age 54. Using medicines with male hormones in them for a long time. Losing weight fast. Having gallstones in your family. Having some health problems, such as diabetes, Crohn's disease, or liver disease. What are the signs or symptoms? Often, there may be gallstones but no symptoms. These gallstones are called silent gallstones. If a gallstone causes a blockage, you may get sudden pain. The pain: Can be in the upper right part of your belly (abdomen). Normally comes at night or after you eat. Can last an hour or more. Can spread to your right shoulder, back, or chest. Can feel like discomfort, burning, or fullness in the upper part of your belly (indigestion). If the blockage lasts more than a few hours, you can get an infection or swelling. You may: Feel like you may vomit. Vomit. Feel bloated. Have belly pain for 5 hours or more. Feel tender in your belly, often in the upper right part and under your ribs. Have fever or  chills. Have skin or the white parts of your eyes turn yellow (jaundice). Have dark pee (urine) or pale poop (stool). How is this treated? Treatment for this condition depends on how bad you feel. If you have symptoms, you may need: Home care, if symptoms are not very bad. Do not eat for 12-24 hours. Drink only water and clear liquids. Start to eat simple or clear foods after 1 or 2 days. Try broths and crackers. You may need medicines for pain or stomach upset or both. If you have an infection, you will need antibiotics. A hospital stay, if you have very bad pain or a very bad infection. Surgery to remove your gallbladder. You may need this if: Gallstones keep coming back. You have very bad symptoms. Medicines to break up gallstones. Medicines: Are best for small gallstones. May be used for up to 6-12 months. A procedure to find and take out gallstones or to break up gallstones. Follow these instructions at home: Medicines Take over-the-counter and prescription medicines only as told by your doctor. If you were prescribed an antibiotic medicine, take it as told by your doctor. Do not stop taking the antibiotic even if you start to feel better. Ask your doctor if the medicine prescribed to you requires you to avoid driving or using machinery. Eating and drinking Drink enough fluid to keep your urine pale yellow. Drink water or clear fluids. This is important when you have pain. Eat healthy foods. Choose: Fewer fatty foods, such as fried foods. Fewer refined carbs. Avoid breads and grains that are highly processed, such as white  bread and white rice. Choose whole grains, such as whole-wheat bread and brown rice. More fiber. Almonds, fresh fruit, and beans are healthy sources. General instructions Keep a healthy weight. Keep all follow-up visits as told by your doctor. This is important. Where to find more information [employer] of Diabetes and Digestive and Kidney Diseases:  [employer] Contact a doctor if: You have sudden pain in the upper right part of your belly. Pain might spread to your right shoulder, back, or chest. You have been diagnosed with gallstones that have no symptoms and you get: Belly pain. Discomfort, burning, or fullness in the upper part of your abdomen. You have dark urine or pale stools. Get help right away if: You have sudden pain in the upper right part of your abdomen, and the pain lasts more than 2 hours. You have pain in your abdomen, and: It lasts more than 5 hours. It keeps getting worse. You have a fever or chills. You keep feeling like you may vomit. You keep vomiting. Your skin or the white parts of your eyes turn yellow. Summary Cholelithiasis happens when gallstones form in the gallbladder. This condition may be caused by a blood disease, too much of a fat-like substance in the bile, or not enough bile salts in bile. Treatment for this condition depends on how bad you feel. If you have symptoms, do not eat or drink. You may need medicines. You may need a hospital stay for very bad pain or a very bad infection. You may need surgery if gallstones keep coming back or if you have very bad symptoms. This information is not intended to replace advice given to you by your health care provider. Make sure you discuss any questions you have with your healthcare provider. Document Revised: 10/26/2019 Document Reviewed: 07/30/2019 Elsevier Patient Education  2022 [employer]   Gallbladder Eating Plan  If you have a gallbladder condition, you may have trouble digesting fats. Eating a low-fat diet can help reduce your symptoms, and may be helpful before and after having surgery to remove your gallbladder (cholecystectomy). Your health care provider may recommend that you work with a diet and nutrition specialist (dietitian) to help you reduce the amount of fat in your diet. What are tips for following this plan? General  guidelines Limit your fat intake to less than 30% of your total daily calories. If you eat around 1,800 calories each day, this is less than 60 grams (g) of fat per day. Fat is an important part of a healthy diet. Eating a low-fat diet can make it hard to maintain a healthy body weight. Ask your dietitian how much fat, calories, and other nutrients you need each day. Eat small, frequent meals throughout the day instead of three large meals. Drink at least 8-10 cups of fluid a day. Drink enough fluid to keep your urine clear or pale yellow. Limit alcohol intake to no more than 1 drink a day for nonpregnant women and 2 drinks a day for men. One drink equals 12 oz of beer, 5 oz of wine, or 1 oz of hard liquor. Reading food labels  Check Nutrition Facts on food labels for the amount of fat per serving. Choose foods with less than 3 grams of fat per serving.  Shopping Choose nonfat and low-fat healthy foods. Look for the words "nonfat," "low fat," or "fat free." Avoid buying processed or prepackaged foods. Cooking Cook using low-fat methods, such as baking, broiling, grilling, or boiling. Cook with small amounts  of healthy fats, such as olive oil, grapeseed oil, canola oil, or sunflower oil. What foods are recommended? All fresh, frozen, or canned fruits and vegetables. Whole grains. Low-fat or non-fat (skim) milk and yogurt. Lean meat, skinless poultry, fish, eggs, and beans. Low-fat protein supplement powders or drinks. Spices and herbs. What foods are not recommended? High-fat foods. These include baked goods, fast food, fatty cuts of meat, ice cream, french toast, sweet rolls, pizza, cheese bread, foods covered with butter, creamy sauces, or cheese. Fried foods. These include french fries, tempura, battered fish, breaded chicken, fried breads, and sweets. Foods with strong odors. Foods that cause bloating and gas. Summary A low-fat diet can be helpful if you have a gallbladder condition,  or before and after gallbladder surgery. Limit your fat intake to less than 30% of your total daily calories. This is about 60 g of fat if you eat 1,800 calories each day. Eat small, frequent meals throughout the day instead of three large meals. This information is not intended to replace advice given to you by your health care provider. Make sure you discuss any questions you have with your healthcare provider. Document Revised: 04/24/2020 Document Reviewed: 04/24/2020 Elsevier Patient Education  2022 [employer].

## 2021-06-03 ENCOUNTER — Ambulatory Visit: Payer: Medicaid Other | Admitting: Surgery

## 2021-06-10 ENCOUNTER — Ambulatory Visit (INDEPENDENT_AMBULATORY_CARE_PROVIDER_SITE_OTHER): Payer: Medicaid Other | Admitting: Surgery

## 2021-06-10 ENCOUNTER — Encounter: Payer: Self-pay | Admitting: Surgery

## 2021-06-10 VITALS — BP 149/94 | HR 91 | Temp 98.5°F | Wt 251.0 lb

## 2021-06-10 DIAGNOSIS — K8020 Calculus of gallbladder without cholecystitis without obstruction: Secondary | ICD-10-CM | POA: Diagnosis not present

## 2021-06-10 NOTE — Patient Instructions (Signed)
The stones in your gallbladder do not seem to be causing you any problems. There is no need for surgery at this time.   Follow-up with our office as needed. Please call and ask to speak with a nurse if you develop questions or concerns.  Continue with your low fat diet.   Cholelithiasis Cholelithiasis happens when gallstones form in the gallbladder. The gallbladder stores bile. Bile is a fluid that helps digest fats. Bile can harden and form into gallstones. If they cause a blockage, they can cause pain (gallbladder attack). What are the causes? This condition may be caused by: Some blood diseases, such as sickle cell anemia. Too much of a fat-like substance (cholesterol) in your bile. Not enough bile salts in your bile. These salts help the body absorb and digest fats. The gallbladder not emptying fully or often enough. This is common in pregnant women. What increases the risk? The following factors may make you more likely to develop this condition: Being male. Being pregnant many times. Eating a lot of fried foods, fat, and refined carbs (refined carbohydrates). Being very overweight (obese). Being older than age 66. Using medicines with male hormones in them for a long time. Losing weight fast. Having gallstones in your family. Having some health problems, such as diabetes, Crohn's disease, or liver disease. What are the signs or symptoms? Often, there may be gallstones but no symptoms. These gallstones are called silent gallstones. If a gallstone causes a blockage, you may get sudden pain. The pain: Can be in the upper right part of your belly (abdomen). Normally comes at night or after you eat. Can last an hour or more. Can spread to your right shoulder, back, or chest. Can feel like discomfort, burning, or fullness in the upper part of your belly (indigestion). If the blockage lasts more than a few hours, you can get an infection or swelling. You may: Feel like you may  vomit. Vomit. Feel bloated. Have belly pain for 5 hours or more. Feel tender in your belly, often in the upper right part and under your ribs. Have fever or chills. Have skin or the white parts of your eyes turn yellow (jaundice). Have dark pee (urine) or pale poop (stool). How is this treated? Treatment for this condition depends on how bad you feel. If you have symptoms, you may need: Home care, if symptoms are not very bad. Do not eat for 12-24 hours. Drink only water and clear liquids. Start to eat simple or clear foods after 1 or 2 days. Try broths and crackers. You may need medicines for pain or stomach upset or both. If you have an infection, you will need antibiotics. A hospital stay, if you have very bad pain or a very bad infection. Surgery to remove your gallbladder. You may need this if: Gallstones keep coming back. You have very bad symptoms. Medicines to break up gallstones. Medicines: Are best for small gallstones. May be used for up to 6-12 months. A procedure to find and take out gallstones or to break up gallstones. Follow these instructions at home: Medicines Take over-the-counter and prescription medicines only as told by your doctor. If you were prescribed an antibiotic medicine, take it as told by your doctor. Do not stop taking the antibiotic even if you start to feel better. Ask your doctor if the medicine prescribed to you requires you to avoid driving or using machinery. Eating and drinking Drink enough fluid to keep your urine pale yellow. Drink water or  clear fluids. This is important when you have pain. Eat healthy foods. Choose: Fewer fatty foods, such as fried foods. Fewer refined carbs. Avoid breads and grains that are highly processed, such as white bread and white rice. Choose whole grains, such as whole-wheat bread and brown rice. More fiber. Almonds, fresh fruit, and beans are healthy sources. General instructions Keep a healthy weight. Keep all  follow-up visits as told by your doctor. This is important. Where to find more information [employer] Diabetes and Digestive and Kidney Diseases: [employer] Contact a doctor if: You have sudden pain in the upper right part of your belly. Pain might spread to your right shoulder, back, or chest. You have been diagnosed with gallstones that have no symptoms and you get: Belly pain. Discomfort, burning, or fullness in the upper part of your abdomen. You have dark urine or pale stools. Get help right away if: You have sudden pain in the upper right part of your abdomen, and the pain lasts more than 2 hours. You have pain in your abdomen, and: It lasts more than 5 hours. It keeps getting worse. You have a fever or chills. You keep feeling like you may vomit. You keep vomiting. Your skin or the white parts of your eyes turn yellow. Summary Cholelithiasis happens when gallstones form in the gallbladder. This condition may be caused by a blood disease, too much of a fat-like substance in the bile, or not enough bile salts in bile. Treatment for this condition depends on how bad you feel. If you have symptoms, do not eat or drink. You may need medicines. You may need a hospital stay for very bad pain or a very bad infection. You may need surgery if gallstones keep coming back or if you have very bad symptoms. This information is not intended to replace advice given to you by your health care provider. Make sure you discuss any questions you have with your health care provider. Document Revised: 10/26/2019 Document Reviewed: 07/30/2019 Elsevier Patient Education  2022 [employer].

## 2021-06-10 NOTE — Progress Notes (Signed)
06/10/2021  History of Present Illness: Randall Gomez is a 50 y.o. male presenting for follow up of cholelithiasis.  He was last seen on 05/06/21 after having an U/S showing cholelithiasis.  He had had a prior episode of emesis that was thought could possibly be related to him swallowing chewing tobacco.  He had no pain issues on his visit.  He was recommended a low fat diet as a precaution.    Today, he reports that he's doing well.  His caregiver Renae Fickle says that he has not had any abdominal pain issues, nausea, or vomiting.  He is on a low fat diet.  Past Medical History: Past Medical History:  Diagnosis Date   Heart defect    Heart murmur    Hepatitis A    Hepatitis B    Mental retardation    Schizoaffective disorder (HCC)      Past Surgical History: --None  Home Medications: Prior to Admission medications   Medication Sig Start Date End Date Taking? Authorizing Provider  albuterol (VENTOLIN HFA) 108 (90 Base) MCG/ACT inhaler Inhale into the lungs every 6 (six) hours as needed for wheezing or shortness of breath.   Yes [provider]  aspirin EC 81 MG tablet Take 81 mg by mouth daily. Swallow whole.   Yes [provider]  atorvastatin (LIPITOR) 20 MG tablet Take 20 mg by mouth daily.   Yes [provider]  benztropine (COGENTIN) 1 MG tablet Take 1 mg by mouth 2 (two) times daily.   Yes [provider]  cabergoline (DOSTINEX) 0.5 MG tablet Take 0.25 mg by mouth 2 (two) times a week.   Yes [provider]  cetirizine (ZYRTEC) 10 MG tablet Take 10 mg by mouth daily.   Yes [provider]  haloperidol (HALDOL) 10 MG tablet Take 10 mg by mouth 2 (two) times daily.   Yes [provider]  methimazole (TAPAZOLE) 5 MG tablet Take 5 mg by mouth 3 (three) times daily.   Yes [provider]  Vitamin D, Ergocalciferol, (DRISDOL) 1.25 MG (50000 UNIT) CAPS capsule Take 50,000 Units by mouth every 7 (seven) days.   Yes [provider]    Allergies: No Known Allergies  Review of Systems: Review of Systems  Constitutional:  Negative for chills and fever.  Respiratory:  Negative for shortness of breath.   Cardiovascular:  Negative for chest pain.  Gastrointestinal:  Negative for abdominal pain, nausea and vomiting.  Skin:  Negative for rash.   Physical Exam BP (!) 149/94   Pulse 91   Temp 98.5 F (36.9 C)   Wt 251 lb (113.9 kg)   SpO2 96%   BMI 34.04 kg/m  CONSTITUTIONAL: No acute distress HEENT:  Normocephalic, atraumatic, extraocular motion intact. RESPIRATORY:  Normal respiratory effort without pathologic use of accessory muscles. CARDIOVASCULAR: Regular rhythm and rate. GI: The abdomen is soft, obese, non-distended, non-tender to palpation. NEUROLOGIC:  Motor and sensation is grossly normal.  Cranial nerves are grossly intact. PSYCH:  Alert and oriented to person, place and time. Affect is normal.  Assessment and Plan: This is a 50 y.o. male with cholelithiasis.  --Discussed with the patient and with his caregiver Renae Fickle that most likely the U/S findings of cholelithiasis is incidental, since he is not having any symptoms of RUQ pain, nausea, or vomiting, and no postprandial issues.  Renae Fickle mentions that sometimes he swallows the juice from the chewing tobacco and sometimes he may chew on cigarette butts.   --Recommended  that he continue a low fat diet.  Return precautions given, particularly if they notice new symptoms of RUQ pain, nausea, or vomiting. --Follow up as needed.  Face-to-face time spent with the patient and care providers was 15 minutes, with more than 50% of the time spent counseling, educating, and coordinating care of the patient.     Howie Ill, MD [facility]

## 2022-06-15 ENCOUNTER — Encounter: Admission: RE | Payer: Self-pay | Source: Home / Self Care

## 2022-06-15 ENCOUNTER — Ambulatory Visit: Admission: RE | Admit: 2022-06-15 | Payer: Medicaid Other | Source: Home / Self Care

## 2022-06-15 SURGERY — COLONOSCOPY WITH PROPOFOL
Anesthesia: General

## 2022-09-27 ENCOUNTER — Encounter: Payer: Self-pay | Admitting: *Deleted

## 2022-09-28 ENCOUNTER — Ambulatory Visit: Admission: RE | Admit: 2022-09-28 | Payer: Medicaid Other | Source: Home / Self Care

## 2022-09-28 ENCOUNTER — Encounter: Admission: RE | Payer: Self-pay | Source: Home / Self Care

## 2022-09-28 ENCOUNTER — Encounter: Payer: Self-pay | Admitting: Anesthesiology

## 2022-09-28 SURGERY — COLONOSCOPY WITH PROPOFOL
Anesthesia: General

## 2024-01-04 ENCOUNTER — Other Ambulatory Visit (HOSPITAL_COMMUNITY): Payer: Self-pay

## 2024-01-23 ENCOUNTER — Ambulatory Visit: Payer: MEDICAID | Admitting: Podiatry

## 2024-02-09 ENCOUNTER — Ambulatory Visit (INDEPENDENT_AMBULATORY_CARE_PROVIDER_SITE_OTHER): Payer: MEDICAID | Admitting: Podiatry

## 2024-02-09 DIAGNOSIS — M79675 Pain in left toe(s): Secondary | ICD-10-CM | POA: Diagnosis not present

## 2024-02-09 DIAGNOSIS — M79674 Pain in right toe(s): Secondary | ICD-10-CM | POA: Diagnosis not present

## 2024-02-09 DIAGNOSIS — B351 Tinea unguium: Secondary | ICD-10-CM

## 2024-02-09 NOTE — Progress Notes (Signed)
  Subjective:  Patient ID: Randall Gomez, male    DOB: [birth year],  MRN: [id number]  Chief Complaint  Patient presents with   Nail Problem    Nail trim    53 y.o. male returns for the above complaint.  Patient presents with thickened onychodystrophy mycotic toenails x 10 mild pain on palpation hurts with ambulation and shoe pressure he would like to have it debrided on his I will do it himself denies any other acute issues  Objective:  There were no vitals filed for this visit. Podiatric Exam: Vascular: dorsalis pedis and posterior tibial pulses are palpable bilateral. Capillary return is immediate. Temperature gradient is WNL. Skin turgor WNL  Sensorium: Normal Semmes Weinstein monofilament test. Normal tactile sensation bilaterally. Nail Exam: Pt has thick disfigured discolored nails with subungual debris noted bilateral entire nail hallux through fifth toenails.  Pain on palpation to the nails. Ulcer Exam: There is no evidence of ulcer or pre-ulcerative changes or infection. Orthopedic Exam: Muscle tone and strength are WNL. No limitations in general ROM. No crepitus or effusions noted.  Skin: No Porokeratosis. No infection or ulcers    Assessment & Plan:   1. Pain due to onychomycosis of toenails of both feet     Patient was evaluated and treated and all questions answered.  Onychomycosis with pain  -Nails palliatively debrided as below. -Educated on self-care  Procedure: Nail Debridement Rationale: pain  Type of Debridement: manual, sharp debridement. Instrumentation: Nail nipper, rotary burr. Number of Nails: 10  Procedures and Treatment: Consent by patient was obtained for treatment procedures. The patient understood the discussion of treatment and procedures well. All questions were answered thoroughly reviewed. Debridement of mycotic and hypertrophic toenails, 1 through 5 bilateral and clearing of subungual debris. No ulceration, no infection noted.  Return Visit-Office  Procedure: Patient instructed to return to the office for a follow up visit 3 months for continued evaluation and treatment.  Tinnie Forehand, DPM    No follow-ups on file.

## 2024-02-17 ENCOUNTER — Other Ambulatory Visit: Payer: Self-pay | Admitting: Podiatry

## 2024-08-09 ENCOUNTER — Ambulatory Visit: Payer: MEDICAID | Admitting: Podiatry
